# Patient Record
Sex: Male | Born: 1955 | Race: White | Hispanic: No | Marital: Single | Smoking: Former smoker
Health system: Southern US, Community
[De-identification: ages and names within clinical notes are randomized; demographics above are authoritative.]

## PROBLEM LIST (undated history)

## (undated) DIAGNOSIS — F200 Paranoid schizophrenia: Secondary | ICD-10-CM

## (undated) DIAGNOSIS — E291 Testicular hypofunction: Secondary | ICD-10-CM

## (undated) DIAGNOSIS — J189 Pneumonia, unspecified organism: Secondary | ICD-10-CM

## (undated) DIAGNOSIS — E871 Hypo-osmolality and hyponatremia: Secondary | ICD-10-CM

## (undated) DIAGNOSIS — M810 Age-related osteoporosis without current pathological fracture: Secondary | ICD-10-CM

## (undated) DIAGNOSIS — E785 Hyperlipidemia, unspecified: Secondary | ICD-10-CM

## (undated) DIAGNOSIS — J9690 Respiratory failure, unspecified, unspecified whether with hypoxia or hypercapnia: Secondary | ICD-10-CM

## (undated) DIAGNOSIS — I10 Essential (primary) hypertension: Secondary | ICD-10-CM

## (undated) DIAGNOSIS — E559 Vitamin D deficiency, unspecified: Secondary | ICD-10-CM

## (undated) DIAGNOSIS — T148XXA Other injury of unspecified body region, initial encounter: Secondary | ICD-10-CM

## (undated) HISTORY — PX: NO PAST SURGERIES: SHX2092

---

## 2005-12-02 ENCOUNTER — Emergency Department (HOSPITAL_COMMUNITY): Admission: EM | Admit: 2005-12-02 | Discharge: 2005-12-02 | Payer: Self-pay | Admitting: Emergency Medicine

## 2014-09-04 ENCOUNTER — Emergency Department (HOSPITAL_COMMUNITY)
Admission: EM | Admit: 2014-09-04 | Discharge: 2014-09-05 | Disposition: A | Discharge status: Home / Self Care | Payer: Medicare Other | Attending: Emergency Medicine | Admitting: Emergency Medicine

## 2014-09-04 ENCOUNTER — Encounter (HOSPITAL_COMMUNITY): Payer: Self-pay | Admitting: Emergency Medicine

## 2014-09-04 DIAGNOSIS — L0231 Cutaneous abscess of buttock: Secondary | ICD-10-CM | POA: Diagnosis present

## 2014-09-04 DIAGNOSIS — Z8781 Personal history of (healed) traumatic fracture: Secondary | ICD-10-CM | POA: Diagnosis not present

## 2014-09-04 DIAGNOSIS — Z8701 Personal history of pneumonia (recurrent): Secondary | ICD-10-CM | POA: Diagnosis not present

## 2014-09-04 DIAGNOSIS — L03317 Cellulitis of buttock: Secondary | ICD-10-CM | POA: Diagnosis not present

## 2014-09-04 DIAGNOSIS — D171 Benign lipomatous neoplasm of skin and subcutaneous tissue of trunk: Secondary | ICD-10-CM

## 2014-09-04 DIAGNOSIS — Z8659 Personal history of other mental and behavioral disorders: Secondary | ICD-10-CM | POA: Diagnosis not present

## 2014-09-04 DIAGNOSIS — D1779 Benign lipomatous neoplasm of other sites: Secondary | ICD-10-CM | POA: Insufficient documentation

## 2014-09-04 DIAGNOSIS — Z8709 Personal history of other diseases of the respiratory system: Secondary | ICD-10-CM | POA: Insufficient documentation

## 2014-09-04 DIAGNOSIS — Z8739 Personal history of other diseases of the musculoskeletal system and connective tissue: Secondary | ICD-10-CM | POA: Diagnosis not present

## 2014-09-04 DIAGNOSIS — Z87891 Personal history of nicotine dependence: Secondary | ICD-10-CM | POA: Diagnosis not present

## 2014-09-04 DIAGNOSIS — I10 Essential (primary) hypertension: Secondary | ICD-10-CM | POA: Diagnosis not present

## 2014-09-04 DIAGNOSIS — Z87448 Personal history of other diseases of urinary system: Secondary | ICD-10-CM | POA: Insufficient documentation

## 2014-09-04 DIAGNOSIS — Z8639 Personal history of other endocrine, nutritional and metabolic disease: Secondary | ICD-10-CM | POA: Diagnosis not present

## 2014-09-04 HISTORY — DX: Pneumonia, unspecified organism: J18.9

## 2014-09-04 HISTORY — DX: Age-related osteoporosis without current pathological fracture: M81.0

## 2014-09-04 HISTORY — DX: Hypo-osmolality and hyponatremia: E87.1

## 2014-09-04 HISTORY — DX: Paranoid schizophrenia: F20.0

## 2014-09-04 HISTORY — DX: Hyperlipidemia, unspecified: E78.5

## 2014-09-04 HISTORY — DX: Testicular hypofunction: E29.1

## 2014-09-04 HISTORY — DX: Other injury of unspecified body region, initial encounter: T14.8XXA

## 2014-09-04 HISTORY — DX: Vitamin D deficiency, unspecified: E55.9

## 2014-09-04 HISTORY — DX: Respiratory failure, unspecified, unspecified whether with hypoxia or hypercapnia: J96.90

## 2014-09-04 HISTORY — DX: Essential (primary) hypertension: I10

## 2014-09-04 MED ORDER — CLINDAMYCIN HCL 150 MG PO CAPS: 150.0000 mg | ORAL_CAPSULE | Freq: Four times a day (QID) | ORAL | Status: DC

## 2014-09-04 MED ORDER — CLINDAMYCIN HCL 300 MG PO CAPS
300.0000 mg | ORAL_CAPSULE | Freq: Once | ORAL | Status: AC
  Administered 2014-09-04: 300 mg via ORAL
  Filled 2014-09-04: qty 1

## 2014-09-04 NOTE — ED Notes (Signed)
patient has a big abcess on lower right hip and upper spine. Patient is in [facility] se[facility] home. Patient is not complaining of pain.

## 2014-09-04 NOTE — ED Notes (Signed)
Bed: GX27 Expected date:  Expected time:  Means of arrival:  Comments: EMS abscess to back - softball size, tachy

## 2014-09-04 NOTE — Discharge Instructions (Signed)
Please read and follow all provided instructions.  Your diagnoses today include:  1. Cellulitis of buttock   2. Lipoma of buttock    Tests performed today include:  Vital signs. See below for your results today.   Medications prescribed:   Clindamycin - antibiotic  You have been prescribed an antibiotic medicine: take the entire course of medicine even if you are feeling better. Stopping early can cause the antibiotic not to work.  Take any prescribed medications only as directed.   Home care instructions:  Follow any educational materials contained in this packet. Keep affected area above the level of your heart when possible. Wash area gently twice a day with warm soapy water. Do not apply alcohol or hydrogen peroxide. Cover the area if it draining or weeping.   Follow-up instructions: See you doctor for a recheck if your symptoms are not significantly improved.   Please follow-up with your primary care provider in the next 1 week for further evaluation of your symptoms.   Return instructions:  Return to the Emergency Department if you have:  Fever  Worsening symptoms  Worsening pain  Worsening swelling  Redness of the skin that moves away from the affected area, especially if it streaks away from the affected area   Any other emergent concerns  Your vital signs today were: BP 136/87 mmHg   Pulse 95   Temp(Src) 99.4 F (37.4 C) (Rectal)   Resp 23   SpO2 93% If your blood pressure (BP) was elevated above 135/85 this visit, please have this repeated by your doctor within one month. --------------

## 2014-09-04 NOTE — ED Provider Notes (Signed)
CSN: [id number]     Arrival date & time 09/04/14  2023 History   First MD Initiated Contact with Patient 09/04/14 2042     Chief Complaint  Patient presents with  . Abscess    patient has a big abcess on lower right hip and upper spine. Patient is in [facility] [place] [facility] home. Patient is not complaining of pain.      (Consider location/radiation/quality/duration/timing/severity/associated sxs/prior Treatment) HPI Comments: Patients with paranoid schizophrenia, nursing home resident, presents by EMS with complaint of abscess. Patient states that he has had a swelling of his right upper buttock for at least 10 years. Per nursing home report, patient had increased redness and swelling overlying this area and he was sent to the emergency department for evaluation. Patient denies fever, nausea, or vomiting. No diarrhea. No drainage from the area. No treatments prior to arrival. Patient states that the area is not painful and denies all other medical complaints.  Patient is a 58 y.o. male presenting with abscess. The history is provided by the patient, the nursing home and medical records.  Abscess Associated symptoms: no fever, no headaches, no nausea and no vomiting     Past Medical History  Diagnosis Date  . Paranoid schizophrenia   . Osteoporosis   . Respiratory failure hypoxic  . Vitamin D deficiency   . Hypertension   . Hyperlipidemia   . Hypogonadism in male   . Pneumonia healthcare-associated  . Hyponatremia   . Fracture spine   History reviewed. No pertinent past surgical history. History reviewed. No pertinent family history. History  Substance Use Topics  . Smoking status: Former Research scientist (life sciences)  . Smokeless tobacco: Never Used  . Alcohol Use: No    Review of Systems  Constitutional: Negative for fever.  HENT: Negative for rhinorrhea and sore throat.   Eyes: Negative for redness.  Respiratory: Negative for cough.   Cardiovascular: Negative for chest pain.   Gastrointestinal: Negative for nausea, vomiting, abdominal pain and diarrhea.  Genitourinary: Negative for dysuria.  Musculoskeletal: Negative for myalgias and back pain.  Skin: Positive for color change. Negative for rash.  Neurological: Negative for headaches.  Hematological: Negative for adenopathy.      Allergies  Review of patient's allergies indicates no known allergies.  Home Medications   Prior to Admission medications   Medication Sig Start Date End Date Taking? Authorizing Provider  clindamycin (CLEOCIN) 150 MG capsule Take 1 capsule (150 mg total) by mouth every 6 (six) hours. 09/04/14   Carlisle Cater, PA-C   BP 120/102 mmHg  Pulse 107  Temp(Src) 99.1 F (37.3 C) (Oral)  Resp 19  SpO2 96% Physical Exam  Constitutional: He appears well-developed and well-nourished.  HENT:  Head: Normocephalic and atraumatic.  Eyes: Conjunctivae are normal.  Neck: Normal range of motion. Neck supple.  Pulmonary/Chest: No respiratory distress.  Neurological: He is alert.  Skin: Skin is warm and dry.  Patient with a large 6-7 cm diameter area of soft tissue swelling on the right upper buttocks consistent with lipoma. Overlying the center is erythema with a central ulceration measuring approximately 8 mm. There is a minimal amount of purulent drainage. There is no induration or fluid collection palpated and no signs of abscess. Area is nontender.  Psychiatric: He has a normal mood and affect.  Nursing note and vitals reviewed.   ED Course  Procedures (including critical care time) Labs Review Labs Reviewed - No data to display  Imaging Review No results found.  EKG Interpretation None       Patient seen and examined. Medications ordered. He appears well, non-toxic.   Vital signs reviewed and are as follows: BP 120/102 mmHg  Pulse 107  Temp(Src) 99.1 F (37.3 C) (Oral)  Resp 19  SpO2 96%  Will treat with clindamycin. Encouraged PCP follow-up in 2-3 days for  recheck. Vital signs are stable. He does not look toxic. Feel that he is safe for discharge to home.  MDM   Final diagnoses:  Cellulitis of buttock  Lipoma of buttock   Patient sent to the emergency department for evaluation of "abscess". Patient has a long-standing lipoma with superficial cellulitic infection overlying at this point. Lipoma does not seem to be new per patient. The redness and swelling appears to be new. Will treat and have patient follow-up with PCP.   Carlisle Cater, PA-C 09/04/14 [place], MD 09/04/14 [phone]

## 2014-10-20 ENCOUNTER — Encounter (HOSPITAL_COMMUNITY): Payer: Self-pay

## 2014-10-20 ENCOUNTER — Inpatient Hospital Stay (HOSPITAL_COMMUNITY)
Admission: EM | Admit: 2014-10-20 | Discharge: 2014-10-27 | DRG: 643 | Disposition: A | Discharge status: Skilled Nursing Facility | Payer: Medicare Other | Attending: Internal Medicine | Admitting: Internal Medicine

## 2014-10-20 ENCOUNTER — Emergency Department (HOSPITAL_COMMUNITY): Payer: Medicare Other

## 2014-10-20 DIAGNOSIS — Z87891 Personal history of nicotine dependence: Secondary | ICD-10-CM | POA: Diagnosis not present

## 2014-10-20 DIAGNOSIS — E291 Testicular hypofunction: Secondary | ICD-10-CM | POA: Diagnosis present

## 2014-10-20 DIAGNOSIS — J439 Emphysema, unspecified: Secondary | ICD-10-CM | POA: Diagnosis present

## 2014-10-20 DIAGNOSIS — I10 Essential (primary) hypertension: Secondary | ICD-10-CM | POA: Diagnosis present

## 2014-10-20 DIAGNOSIS — E785 Hyperlipidemia, unspecified: Secondary | ICD-10-CM | POA: Diagnosis present

## 2014-10-20 DIAGNOSIS — F200 Paranoid schizophrenia: Secondary | ICD-10-CM | POA: Diagnosis present

## 2014-10-20 DIAGNOSIS — R748 Abnormal levels of other serum enzymes: Secondary | ICD-10-CM | POA: Diagnosis present

## 2014-10-20 DIAGNOSIS — Z79899 Other long term (current) drug therapy: Secondary | ICD-10-CM | POA: Diagnosis not present

## 2014-10-20 DIAGNOSIS — D72829 Elevated white blood cell count, unspecified: Secondary | ICD-10-CM | POA: Diagnosis present

## 2014-10-20 DIAGNOSIS — E559 Vitamin D deficiency, unspecified: Secondary | ICD-10-CM | POA: Diagnosis present

## 2014-10-20 DIAGNOSIS — R000 Tachycardia, unspecified: Secondary | ICD-10-CM | POA: Diagnosis present

## 2014-10-20 DIAGNOSIS — M810 Age-related osteoporosis without current pathological fracture: Secondary | ICD-10-CM | POA: Diagnosis present

## 2014-10-20 DIAGNOSIS — E222 Syndrome of inappropriate secretion of antidiuretic hormone: Secondary | ICD-10-CM | POA: Diagnosis not present

## 2014-10-20 DIAGNOSIS — W19XXXA Unspecified fall, initial encounter: Secondary | ICD-10-CM | POA: Diagnosis present

## 2014-10-20 DIAGNOSIS — R451 Restlessness and agitation: Secondary | ICD-10-CM | POA: Diagnosis present

## 2014-10-20 DIAGNOSIS — E871 Hypo-osmolality and hyponatremia: Secondary | ICD-10-CM | POA: Diagnosis present

## 2014-10-20 DIAGNOSIS — Z993 Dependence on wheelchair: Secondary | ICD-10-CM | POA: Diagnosis not present

## 2014-10-20 DIAGNOSIS — G9341 Metabolic encephalopathy: Secondary | ICD-10-CM | POA: Diagnosis present

## 2014-10-20 LAB — URINALYSIS, ROUTINE W REFLEX MICROSCOPIC
BILIRUBIN URINE: NEGATIVE
Glucose, UA: NEGATIVE mg/dL
HGB URINE DIPSTICK: NEGATIVE
KETONES UR: 15 mg/dL — AB
Leukocytes, UA: NEGATIVE
Nitrite: NEGATIVE
Protein, ur: 30 mg/dL — AB
Specific Gravity, Urine: 1.015 (ref 1.005–1.030)
UROBILINOGEN UA: 0.2 mg/dL (ref 0.0–1.0)
pH: 7.5 (ref 5.0–8.0)

## 2014-10-20 LAB — CBC WITH DIFFERENTIAL/PLATELET
Basophils Absolute: 0 10*3/uL (ref 0.0–0.1)
Basophils Relative: 0 % (ref 0–1)
Eosinophils Absolute: 0 10*3/uL (ref 0.0–0.7)
Eosinophils Relative: 0 % (ref 0–5)
HEMATOCRIT: 35.5 % — AB (ref 39.0–52.0)
HEMOGLOBIN: 13.1 g/dL (ref 13.0–17.0)
Lymphocytes Relative: 9 % — ABNORMAL LOW (ref 12–46)
Lymphs Abs: 1.5 10*3/uL (ref 0.7–4.0)
MCH: 31 pg (ref 26.0–34.0)
MCHC: 36.9 g/dL — ABNORMAL HIGH (ref 30.0–36.0)
MCV: 84.1 fL (ref 78.0–100.0)
Monocytes Absolute: 1.5 10*3/uL — ABNORMAL HIGH (ref 0.1–1.0)
Monocytes Relative: 9 % (ref 3–12)
NEUTROS ABS: 13.2 10*3/uL — AB (ref 1.7–7.7)
NEUTROS PCT: 82 % — AB (ref 43–77)
Platelets: 368 10*3/uL (ref 150–400)
RBC: 4.22 MIL/uL (ref 4.22–5.81)
RDW: 12.8 % (ref 11.5–15.5)
WBC: 16.2 10*3/uL — AB (ref 4.0–10.5)

## 2014-10-20 LAB — COMPREHENSIVE METABOLIC PANEL
ALBUMIN: 3.5 g/dL (ref 3.5–5.2)
ALK PHOS: 85 U/L (ref 39–117)
ALT: 20 U/L (ref 0–53)
AST: 42 U/L — ABNORMAL HIGH (ref 0–37)
Anion gap: 19 — ABNORMAL HIGH (ref 5–15)
BILIRUBIN TOTAL: 0.8 mg/dL (ref 0.3–1.2)
BUN: 6 mg/dL (ref 6–23)
CO2: 23 mmol/L (ref 19–32)
CREATININE: 0.52 mg/dL (ref 0.50–1.35)
Calcium: 9.3 mg/dL (ref 8.4–10.5)
Chloride: 72 mEq/L — ABNORMAL LOW (ref 96–112)
GFR calc non Af Amer: >90 mL/min (ref >90)
GLUCOSE: 134 mg/dL — AB (ref 70–99)
POTASSIUM: 3.8 mmol/L (ref 3.5–5.1)
Sodium: 114 mmol/L — CL (ref 135–145)
TOTAL PROTEIN: 6.5 g/dL (ref 6.0–8.3)

## 2014-10-20 LAB — URINE MICROSCOPIC-ADD ON

## 2014-10-20 LAB — BASIC METABOLIC PANEL
Anion gap: 9 (ref 5–15)
BUN: 8 mg/dL (ref 6–23)
CALCIUM: 8.4 mg/dL (ref 8.4–10.5)
CO2: 26 mmol/L (ref 19–32)
Chloride: 83 mEq/L — ABNORMAL LOW (ref 96–112)
Creatinine, Ser: 0.48 mg/dL — ABNORMAL LOW (ref 0.50–1.35)
GLUCOSE: 117 mg/dL — AB (ref 70–99)
POTASSIUM: 3.5 mmol/L (ref 3.5–5.1)
SODIUM: 118 mmol/L — AB (ref 135–145)

## 2014-10-20 LAB — SODIUM, URINE, RANDOM: Sodium, Ur: 22 mmol/L

## 2014-10-20 LAB — OSMOLALITY: Osmolality: 238 mOsm/kg — ABNORMAL LOW (ref 275–300)

## 2014-10-20 LAB — TSH: TSH: 0.919 u[IU]/mL (ref 0.350–4.500)

## 2014-10-20 MED ORDER — SODIUM CHLORIDE 0.9 % IV BOLUS (SEPSIS)
500.0000 mL | Freq: Once | INTRAVENOUS | Status: AC
Start: 2014-10-20 — End: 2014-10-20
  Administered 2014-10-20: 500 mL via INTRAVENOUS

## 2014-10-20 MED ORDER — LORAZEPAM 2 MG/ML IJ SOLN
0.5000 mg | Freq: Once | INTRAMUSCULAR | Status: AC
  Administered 2014-10-20: 0.5 mg via INTRAVENOUS
  Filled 2014-10-20: qty 1

## 2014-10-20 MED ORDER — LORAZEPAM 2 MG/ML IJ SOLN
1.0000 mg | Freq: Once | INTRAMUSCULAR | Status: AC
  Administered 2014-10-20: 1 mg via INTRAVENOUS
  Filled 2014-10-20: qty 1

## 2014-10-20 MED ORDER — DEXTROSE 5 % IV SOLN
1.0000 g | Freq: Once | INTRAVENOUS | Status: AC
  Administered 2014-10-20: 1 g via INTRAVENOUS
  Filled 2014-10-20: qty 10

## 2014-10-20 MED ORDER — SODIUM CHLORIDE 0.9 % IV BOLUS (SEPSIS)
1000.0000 mL | Freq: Once | INTRAVENOUS | Status: AC
  Administered 2014-10-20: 1000 mL via INTRAVENOUS

## 2014-10-20 NOTE — ED Provider Notes (Signed)
CSN: [id number]     Arrival date & time 10/20/14  59 History   First MD Initiated Contact with Patient 10/20/14 1643     Chief Complaint  Patient presents with  . Hypertension  . Agitation    Level V caveat secondary to illness and schizophrenia (Consider location/radiation/quality/duration/timing/severity/associated sxs/prior Treatment) HPI   59 year old male with a history of schizophrenia sent here from nursing home with reports that he has been more agitated than usual. He normally gets multiple behavior health medications including Risperdal and Ativan on a regular basis. Patient only answers when questioned her my entire interview with the single word. This has been consistent throughout nursing and, physician, and EMS interactions. Attempted to contact nursing home have not been able to call them. We have also attempted to call the daughter had not been able to get through her.  Past Medical History  Diagnosis Date  . Paranoid schizophrenia   . Osteoporosis   . Respiratory failure hypoxic  . Vitamin D deficiency   . Hypertension   . Hyperlipidemia   . Hypogonadism in male   . Pneumonia healthcare-associated  . Hyponatremia   . Fracture spine   History reviewed. No pertinent past surgical history. No family history on file. History  Substance Use Topics  . Smoking status: Former Research scientist (life sciences)  . Smokeless tobacco: Never Used  . Alcohol Use: No    Review of Systems  Unable to perform ROS     Allergies  Review of patient's allergies indicates no known allergies.  Home Medications   Prior to Admission medications   Medication Sig Start Date End Date Taking? Authorizing Provider  benztropine (COGENTIN) 1 MG tablet Take 1 mg by mouth 2 (two) times daily.   Yes Historical Provider, MD  calcitonin, salmon, (MIACALCIN/FORTICAL) 200 UNIT/ACT nasal spray Place 1 spray into alternate nostrils daily.   Yes Historical Provider, MD  escitalopram (LEXAPRO) 20 MG tablet Take 20  mg by mouth daily.   Yes Historical Provider, MD  LORazepam (ATIVAN) 1 MG tablet Take 1 mg by mouth every 8 (eight) hours.   Yes Historical Provider, MD  metoprolol tartrate (LOPRESSOR) 25 MG tablet Take 25 mg by mouth 2 (two) times daily.   Yes Historical Provider, MD  risperiDONE (RISPERDAL) 2 MG tablet Take 2 mg by mouth 2 (two) times daily.   Yes Historical Provider, MD  risperiDONE (RISPERDAL) 3 MG tablet Take 3 mg by mouth at bedtime.   Yes Historical Provider, MD  simvastatin (ZOCOR) 20 MG tablet Take 20 mg by mouth at bedtime.   Yes Historical Provider, MD  clindamycin (CLEOCIN) 150 MG capsule Take 1 capsule (150 mg total) by mouth every 6 (six) hours. 09/04/14   Carlisle Cater, PA-C   BP 149/89 mmHg  Pulse 114  Temp(Src) 97.7 F (36.5 C) (Axillary)  Resp 21  SpO2 94% Physical Exam  Constitutional: He appears well-developed and well-nourished.  HENT:  Head: Normocephalic and atraumatic.  Right Ear: External ear normal.  Left Ear: External ear normal.  Lips appear dry  Eyes: Conjunctivae are normal. Pupils are equal, round, and reactive to light.  Neck: Normal range of motion.  Posterior neck with lipoma appears to be nontender well delineated edges  Cardiovascular: Tachycardia present.   Pulmonary/Chest: Effort normal and breath sounds normal.  Abdominal: Soft. Bowel sounds are normal.  Musculoskeletal: Normal range of motion. He exhibits no edema or tenderness.  Neurological: He is alert.  I am unable to assess whether or not the patient  is oriented as he does not answer questions he does obey commands and cooperative with exam and moves all extremities equally. Deep tendon reflexes are equal.  Skin: Skin is warm and dry.  Assessed skin see no signs of cellulitis  Nursing note and vitals reviewed.   ED Course  Procedures (including critical care time) Labs Review Labs Reviewed  CBC WITH DIFFERENTIAL - Abnormal; Notable for the following:    WBC 16.2 (*)    HCT 35.5  (*)    MCHC 36.9 (*)    Neutrophils Relative % 82 (*)    Lymphocytes Relative 9 (*)    Neutro Abs 13.2 (*)    Monocytes Absolute 1.5 (*)    All other components within normal limits  COMPREHENSIVE METABOLIC PANEL - Abnormal; Notable for the following:    Sodium 114 (*)    Chloride 72 (*)    Glucose, Bld 134 (*)    AST 42 (*)    Anion gap 19 (*)    All other components within normal limits  URINALYSIS, ROUTINE W REFLEX MICROSCOPIC - Abnormal; Notable for the following:    APPearance HAZY (*)    Ketones, ur 15 (*)    Protein, ur 30 (*)    All other components within normal limits  URINE MICROSCOPIC-ADD ON  OSMOLALITY    Imaging Review Dg Chest Port 1 View  10/20/2014   CLINICAL DATA:  Altered mental status for 1 day. History of hypertension and smoking. Initial encounter.  EXAM: PORTABLE CHEST - 1 VIEW  COMPARISON:  08/21/2014 and 08/19/2014 radiographs; CT 08/03/2014.  FINDINGS: 1827 hr. The heart size and mediastinal contours appear stable allowing for some patient rotation to the right. This is likely responsible for mild tracheal deviation to the right. The pulmonary edema noted previously has resolved. There is evidence of underlying mild emphysema. No superimposed airspace disease or pleural effusion identified. Advanced glenohumeral degenerative changes are present on the left. The patient is status post right total shoulder arthroplasty.  IMPRESSION: No acute cardiopulmonary process.  Mild emphysema.   Electronically Signed   By: Camie Patience M.D.   On: 10/20/2014 18:52     EKG Interpretation   Date/Time:  Wednesday October 20 2014 18:07:51 EST Ventricular Rate:  115 PR Interval:  160 QRS Duration: 87 QT Interval:  349 QTC Calculation: 483 R Axis:   -4 Text Interpretation:  Sinus tachycardia Borderline prolonged QT interval  Confirmed by Tilda Samudio MD, Kennadie Brenner ([id number]) on 10/20/2014 8:35:07 PM     Filed Vitals:   10/20/14 1731 10/20/14 1815 10/20/14 1845 10/20/14 1915  BP:  164/93 150/103 139/89 149/89  Pulse: 117 114 112 114  Temp:      TempSrc:      Resp: 18 21    SpO2: 99% 98% 95% 94%    MDM   Final diagnoses:  Agitated  Hyponatremia  Paranoid schizophrenia   59 year old male with increased agitation with history of schizophrenia. Since workup here is significant for sodium of 114. He has had no evidence of seizure . It is unclear the chronicity of the hyponatremia. His symptoms appear to be mild to moderate without any evidence of seizure, obtundation, coma, respiratory distress. He does appear agitated from baseline which could be due to his hyponatremia. Patient is receiving IV normal saline. A serum osmolality is pending. Plan admission.  Discussed with Dr. Hal Hope who asks that critical care consult be obtained.  Critical care paged.   Shaune Pollack, MD 10/21/14  0011 

## 2014-10-20 NOTE — ED Notes (Signed)
Monia Pouch  [phone]  Sister Lives in [place] and is three hours behind. States she turns off her phone at night and will answer to a voicemail.

## 2014-10-20 NOTE — ED Notes (Addendum)
RN Lennette Bihari reports witnessed fall. Patient slid to the end of the bed and stood up on his own. Patient wobbled before RN could get to patient and then RN reported to this RN that patient fell to the ground on the left side. Report patient did not hit his head. Patient fell on his left side and hit left thigh and left arm. Patient was able to get back in bed on his own. Patient denies any pain and does not have any noted injuries from the fall. MD Ray made aware. Patient is in bed and hooked up to the monitor when arrived to the room. Not agitated. No injuries noted. MD assessed patient for injury.

## 2014-10-20 NOTE — ED Notes (Signed)
Attempted to call facility and daughter multiple times to receive information about patient's baseline. Unable to reach anyone from multiple numbers.

## 2014-10-20 NOTE — ED Notes (Signed)
Admitting MD at the bedside.  

## 2014-10-20 NOTE — Consult Note (Signed)
Name: Jeremiah [name] MRN: [id number] DOB: 1956-03-18    ADMISSION DATE:  10/20/2014 CONSULTATION DATE:  10/20/14  REFERRING MD :  Pattricia Boss  CHIEF COMPLAINT:  Hyponatremia  BRIEF PATIENT DESCRIPTION: Jeremiah [name] is a 59yo man w/ PMHx of paranoid schizophrenia and HTN who presented to the ED from [facility] after he was found to have a change in mental status. Found to be hyponatremic with Na 114. No seizure activity noted.   SIGNIFICANT EVENTS  1/13>> Change in mental status per nursing home, Na 114  STUDIES:  CXR 1/13>> mild emphysema, no acute process   HISTORY OF PRESENT ILLNESS:  Jeremiah [name] is a 59yo man w/ PMHx of paranoid schizophrenia and HTN who presented to the ED from [facility] after he was found to have a change in mental status. Report from nursing home states he was more agitated than usual, diaphoretic, and was pacing in his wheelchair. In the ED, he was found to be hyponatremic with Na of 114. PCCM was consulted. No reported seizures at nursing home or in the ED. He responds minimally with yes or no to questions. Does not appear to be in any acute distress. Last Na on record was 136 in November 2015.   PAST MEDICAL HISTORY :   has a past medical history of Paranoid schizophrenia; Osteoporosis; Respiratory failure (hypoxic); Vitamin D deficiency; Hypertension; Hyperlipidemia; Hypogonadism in male; Pneumonia (healthcare-associated); Hyponatremia; and Fracture (spine).  has no past surgical history on file. Prior to Admission medications   Medication Sig Start Date End Date Taking? Authorizing Provider  benztropine (COGENTIN) 1 MG tablet Take 1 mg by mouth 2 (two) times daily.   Yes Historical Provider, MD  calcitonin, salmon, (MIACALCIN/FORTICAL) 200 UNIT/ACT nasal spray Place 1 spray into alternate nostrils daily.   Yes Historical Provider, MD  escitalopram (LEXAPRO) 20 MG tablet Take 20 mg by mouth daily.   Yes Historical Provider, MD  LORazepam  (ATIVAN) 1 MG tablet Take 1 mg by mouth every 8 (eight) hours.   Yes Historical Provider, MD  metoprolol tartrate (LOPRESSOR) 25 MG tablet Take 25 mg by mouth 2 (two) times daily.   Yes Historical Provider, MD  risperiDONE (RISPERDAL) 2 MG tablet Take 2 mg by mouth 2 (two) times daily.   Yes Historical Provider, MD  risperiDONE (RISPERDAL) 3 MG tablet Take 3 mg by mouth at bedtime.   Yes Historical Provider, MD  simvastatin (ZOCOR) 20 MG tablet Take 20 mg by mouth at bedtime.   Yes Historical Provider, MD  clindamycin (CLEOCIN) 150 MG capsule Take 1 capsule (150 mg total) by mouth every 6 (six) hours. 09/04/14   Carlisle Cater, PA-C   No Known Allergies  FAMILY HISTORY:  family history is not on file. SOCIAL HISTORY:  reports that he has quit smoking. He has never used smokeless tobacco. He reports that he does not drink alcohol or use illicit drugs.  REVIEW OF SYSTEMS:   Not able to complete due to patient's mental status  SUBJECTIVE: Responds yes or no to questions occasionally.   VITAL SIGNS: Temp:  [97.7 F (36.5 C)] 97.7 F (36.5 C) (01/13 1557) Pulse Rate:  [112-126] 126 (01/13 2100) Resp:  [18-21] 21 (01/13 2100) BP: (139-187)/(89-110) 187/110 mmHg (01/13 2100) SpO2:  [94 %-99 %] 95 % (01/13 2100)  PHYSICAL EXAMINATION: General:  Awake, alert, laying in bed Neuro: alert, only answers some questions with yes or no. Follows commands. EOMI, PERRL. Strength intact. Mild fasciculations noted in right  calf.  HEENT:  [place]/AT, EOMI, PERRL, mucus membranes dry Cardiovascular: tachycardic in 100s, normal S1/S2, no m/g/r Lungs: CTA bilaterally, breaths non-labored Abdomen:  BS+, soft, non-tender Musculoskeletal: warm, no edema, moves all Skin:  Warm and well perfused   Recent Labs Lab 10/20/14 1759  NA 114*  K 3.8  CL 72*  CO2 23  BUN 6  CREATININE 0.52  GLUCOSE 134*    Recent Labs Lab 10/20/14 1759  HGB 13.1  HCT 35.5*  WBC 16.2*  PLT 368   Dg Chest Port 1  View  10/20/2014   CLINICAL DATA:  Altered mental status for 1 day. History of hypertension and smoking. Initial encounter.  EXAM: PORTABLE CHEST - 1 VIEW  COMPARISON:  08/21/2014 and 08/19/2014 radiographs; CT 08/03/2014.  FINDINGS: 1827 hr. The heart size and mediastinal contours appear stable allowing for some patient rotation to the right. This is likely responsible for mild tracheal deviation to the right. The pulmonary edema noted previously has resolved. There is evidence of underlying mild emphysema. No superimposed airspace disease or pleural effusion identified. Advanced glenohumeral degenerative changes are present on the left. The patient is status post right total shoulder arthroplasty.  IMPRESSION: No acute cardiopulmonary process.  Mild emphysema.   Electronically Signed   By: Camie Patience M.D.   On: 10/20/2014 18:52    ASSESSMENT / PLAN:  Hyponatremia A: Na 114 on admission. Likely medication induced. Patient is on SSRI (Lexapro) and Risperdal for his schizophrenia which are known to cause hyponatremia. He does not have chronic liver disease or heart failure to suggest volume depletion as the cause. He is also not on any diuretics.   P: Recommend to work up hyponatremia with urine sodium, urine osm, serum osm.  Check TSH Check bmets Q4H 600 cc water restriction Admit to step down PCCM will continue to follow, [facility] to admit  Albin Felling, MD Internal Medicine Resident, PGY-1  Pulmonary and [place] Pager: [phone]  10/20/2014, 9:54 PM

## 2014-10-20 NOTE — ED Notes (Signed)
Per EMS, Patient is coming from [facility] and they report increase agitation since 1000. Patient given Ativan per MD order. Patient had increase BP and HR this morning. Vitals per EMS: 151/100, 104 HR, 20 RR, 97% on RA, 108 CBG.

## 2014-10-20 NOTE — ED Notes (Signed)
Called Staffing for Safety sitter and made Charge aware of the call. Patient currently sleeping.

## 2014-10-20 NOTE — ED Notes (Signed)
Nevin Bloodgood, NT sitting at patient bedside.

## 2014-10-20 NOTE — ED Notes (Signed)
Walking by pt's room and witnessed pt stand up out of bed and fall to the floor onto his left side. Assisted pt back up to bed with NT. Pt not in any acute distress. Pt able to stand with assistance. No pain or injury noted.

## 2014-10-21 ENCOUNTER — Inpatient Hospital Stay (HOSPITAL_COMMUNITY): Payer: Medicare Other

## 2014-10-21 ENCOUNTER — Encounter (HOSPITAL_COMMUNITY): Payer: Self-pay | Admitting: Internal Medicine

## 2014-10-21 DIAGNOSIS — F200 Paranoid schizophrenia: Secondary | ICD-10-CM

## 2014-10-21 DIAGNOSIS — G9341 Metabolic encephalopathy: Secondary | ICD-10-CM | POA: Diagnosis present

## 2014-10-21 DIAGNOSIS — E785 Hyperlipidemia, unspecified: Secondary | ICD-10-CM | POA: Diagnosis present

## 2014-10-21 DIAGNOSIS — F259 Schizoaffective disorder, unspecified: Secondary | ICD-10-CM

## 2014-10-21 DIAGNOSIS — R451 Restlessness and agitation: Secondary | ICD-10-CM

## 2014-10-21 DIAGNOSIS — R748 Abnormal levels of other serum enzymes: Secondary | ICD-10-CM | POA: Diagnosis present

## 2014-10-21 DIAGNOSIS — E871 Hypo-osmolality and hyponatremia: Secondary | ICD-10-CM

## 2014-10-21 DIAGNOSIS — I10 Essential (primary) hypertension: Secondary | ICD-10-CM | POA: Diagnosis present

## 2014-10-21 DIAGNOSIS — E222 Syndrome of inappropriate secretion of antidiuretic hormone: Secondary | ICD-10-CM | POA: Diagnosis present

## 2014-10-21 LAB — CBC WITH DIFFERENTIAL/PLATELET
Basophils Absolute: 0 10*3/uL (ref 0.0–0.1)
Basophils Relative: 0 % (ref 0–1)
EOS ABS: 0 10*3/uL (ref 0.0–0.7)
Eosinophils Relative: 0 % (ref 0–5)
HEMATOCRIT: 34.8 % — AB (ref 39.0–52.0)
HEMOGLOBIN: 12.8 g/dL — AB (ref 13.0–17.0)
LYMPHS PCT: 7 % — AB (ref 12–46)
Lymphs Abs: 0.8 10*3/uL (ref 0.7–4.0)
MCH: 31.8 pg (ref 26.0–34.0)
MCHC: 36.8 g/dL — ABNORMAL HIGH (ref 30.0–36.0)
MCV: 86.6 fL (ref 78.0–100.0)
MONO ABS: 1.7 10*3/uL — AB (ref 0.1–1.0)
Monocytes Relative: 15 % — ABNORMAL HIGH (ref 3–12)
NEUTROS PCT: 78 % — AB (ref 43–77)
Neutro Abs: 9.1 10*3/uL — ABNORMAL HIGH (ref 1.7–7.7)
Platelets: 325 10*3/uL (ref 150–400)
RBC: 4.02 MIL/uL — ABNORMAL LOW (ref 4.22–5.81)
RDW: 13 % (ref 11.5–15.5)
WBC: 11.2 10*3/uL — ABNORMAL HIGH (ref 4.0–10.5)

## 2014-10-21 LAB — AMMONIA: Ammonia: 31 umol/L (ref 11–32)

## 2014-10-21 LAB — BASIC METABOLIC PANEL
ANION GAP: 8 (ref 5–15)
Anion gap: 7 (ref 5–15)
BUN: 6 mg/dL (ref 6–23)
BUN: 7 mg/dL (ref 6–23)
CHLORIDE: 87 meq/L — AB (ref 96–112)
CHLORIDE: 88 meq/L — AB (ref 96–112)
CO2: 28 mmol/L (ref 19–32)
CO2: 28 mmol/L (ref 19–32)
CREATININE: 0.54 mg/dL (ref 0.50–1.35)
Calcium: 8.7 mg/dL (ref 8.4–10.5)
Calcium: 8.3 mg/dL — ABNORMAL LOW (ref 8.4–10.5)
Creatinine, Ser: 0.47 mg/dL — ABNORMAL LOW (ref 0.50–1.35)
GFR calc Af Amer: >90 mL/min (ref >90)
Glucose, Bld: 114 mg/dL — ABNORMAL HIGH (ref 70–99)
Glucose, Bld: 121 mg/dL — ABNORMAL HIGH (ref 70–99)
Potassium: 3.5 mmol/L (ref 3.5–5.1)
Potassium: 3.6 mmol/L (ref 3.5–5.1)
Sodium: 123 mmol/L — ABNORMAL LOW (ref 135–145)
Sodium: 123 mmol/L — ABNORMAL LOW (ref 135–145)

## 2014-10-21 LAB — HEPATIC FUNCTION PANEL
ALK PHOS: 79 U/L (ref 39–117)
ALT: 19 U/L (ref 0–53)
AST: 44 U/L — AB (ref 0–37)
Albumin: 3.1 g/dL — ABNORMAL LOW (ref 3.5–5.2)
BILIRUBIN DIRECT: 0.1 mg/dL (ref 0.0–0.3)
BILIRUBIN TOTAL: 0.7 mg/dL (ref 0.3–1.2)
Indirect Bilirubin: 0.6 mg/dL (ref 0.3–0.9)
TOTAL PROTEIN: 5.9 g/dL — AB (ref 6.0–8.3)

## 2014-10-21 LAB — OSMOLALITY, URINE: Osmolality, Ur: 342 mOsm/kg — ABNORMAL LOW (ref 390–1090)

## 2014-10-21 LAB — LACTIC ACID, PLASMA: LACTIC ACID, VENOUS: 0.9 mmol/L (ref 0.5–2.2)

## 2014-10-21 LAB — CK: CK TOTAL: 768 U/L — AB (ref 7–232)

## 2014-10-21 LAB — URIC ACID: URIC ACID, SERUM: 3 mg/dL — AB (ref 4.0–7.8)

## 2014-10-21 LAB — MRSA PCR SCREENING: MRSA by PCR: NEGATIVE

## 2014-10-21 MED ORDER — METOPROLOL TARTRATE 1 MG/ML IV SOLN
5.0000 mg | Freq: Three times a day (TID) | INTRAVENOUS | Status: DC
  Administered 2014-10-21 – 2014-10-22 (×3): 5 mg via INTRAVENOUS
  Filled 2014-10-21 (×6): qty 5

## 2014-10-21 MED ORDER — ACETAMINOPHEN 650 MG RE SUPP
650.0000 mg | Freq: Four times a day (QID) | RECTAL | Status: DC | PRN
Start: 2014-10-21 — End: 2014-10-27

## 2014-10-21 MED ORDER — BENZTROPINE MESYLATE 0.5 MG PO TABS
0.5000 mg | ORAL_TABLET | Freq: Two times a day (BID) | ORAL | Status: DC | PRN
  Filled 2014-10-21: qty 1

## 2014-10-21 MED ORDER — CALCITONIN (SALMON) 200 UNIT/ACT NA SOLN
1.0000 | Freq: Every day | NASAL | Status: DC
  Administered 2014-10-21 – 2014-10-27 (×7): 1 via NASAL
  Filled 2014-10-21: qty 3.7

## 2014-10-21 MED ORDER — ONDANSETRON HCL 4 MG PO TABS: 4.0000 mg | ORAL_TABLET | Freq: Four times a day (QID) | ORAL | Status: DC | PRN

## 2014-10-21 MED ORDER — LORAZEPAM 1 MG PO TABS
1.0000 mg | ORAL_TABLET | Freq: Three times a day (TID) | ORAL | Status: DC
  Administered 2014-10-21 – 2014-10-23 (×7): 1 mg via ORAL
  Filled 2014-10-21 (×7): qty 1

## 2014-10-21 MED ORDER — SIMVASTATIN 20 MG PO TABS
20.0000 mg | ORAL_TABLET | Freq: Every day | ORAL | Status: DC
  Administered 2014-10-21: 20 mg via ORAL
  Filled 2014-10-21 (×2): qty 1

## 2014-10-21 MED ORDER — POTASSIUM CHLORIDE CRYS ER 20 MEQ PO TBCR
20.0000 meq | EXTENDED_RELEASE_TABLET | ORAL | Status: AC
  Administered 2014-10-21 (×2): 20 meq via ORAL
  Filled 2014-10-21 (×2): qty 1

## 2014-10-21 MED ORDER — LORAZEPAM 2 MG/ML IJ SOLN
0.5000 mg | Freq: Once | INTRAMUSCULAR | Status: AC
  Administered 2014-10-21: 0.5 mg via INTRAVENOUS
  Filled 2014-10-21: qty 1

## 2014-10-21 MED ORDER — ENOXAPARIN SODIUM 40 MG/0.4ML ~~LOC~~ SOLN
40.0000 mg | Freq: Every day | SUBCUTANEOUS | Status: DC
  Administered 2014-10-21 – 2014-10-27 (×6): 40 mg via SUBCUTANEOUS
  Filled 2014-10-21 (×9): qty 0.4

## 2014-10-21 MED ORDER — LORAZEPAM 2 MG/ML IJ SOLN: 1.0000 mg | INTRAMUSCULAR | Status: DC | PRN

## 2014-10-21 MED ORDER — ACETAMINOPHEN 325 MG PO TABS: 650.0000 mg | ORAL_TABLET | Freq: Four times a day (QID) | ORAL | Status: DC | PRN

## 2014-10-21 MED ORDER — METOPROLOL TARTRATE 25 MG PO TABS
25.0000 mg | ORAL_TABLET | Freq: Two times a day (BID) | ORAL | Status: DC
  Administered 2014-10-21 (×2): 25 mg via ORAL
  Filled 2014-10-21 (×3): qty 1

## 2014-10-21 MED ORDER — SODIUM CHLORIDE 0.9 % IJ SOLN
3.0000 mL | Freq: Two times a day (BID) | INTRAMUSCULAR | Status: DC
  Administered 2014-10-21 – 2014-10-27 (×9): 3 mL via INTRAVENOUS

## 2014-10-21 MED ORDER — ONDANSETRON HCL 4 MG/2ML IJ SOLN: 4.0000 mg | Freq: Four times a day (QID) | INTRAMUSCULAR | Status: DC | PRN

## 2014-10-21 MED ORDER — RISPERIDONE 1 MG PO TABS
1.0000 mg | ORAL_TABLET | Freq: Two times a day (BID) | ORAL | Status: DC
Start: 2014-10-21 — End: 2014-10-27
  Administered 2014-10-21 – 2014-10-27 (×13): 1 mg via ORAL
  Filled 2014-10-21 (×15): qty 1

## 2014-10-21 NOTE — Consult Note (Signed)
[facility] Consult   Reason for Consult:  Medication management and hyponatremia Referring Physician:  Thyra Breed, NP  Jeremiah [name] is an 59 y.o. male. Total Time spent with patient: 45 minutes  Assessment: AXIS I:  Schizoaffective Disorder AXIS II:  Deferred AXIS III:   Past Medical History  Diagnosis Date  . Paranoid schizophrenia   . Osteoporosis   . Respiratory failure hypoxic  . Vitamin D deficiency   . Hypertension   . Hyperlipidemia   . Hypogonadism in male   . Pneumonia healthcare-associated  . Hyponatremia   . Fracture spine   AXIS IV:  other psychosocial or environmental problems, problems related to social environment and problems with primary support group AXIS V:  51-60 moderate symptoms  Plan:  Case discussed with Jeremiah Huh, NP  We will discontinue Lexapro secondary to hyponatremia Restart low dose of risperidone 1 mg twice daily and benztropine 0.5 mg twice daily as needed to prevent psychotic episodes and these 2 medication does not contributed to hyponatremia Supportive therapy provided about ongoing stressors.  Appreciate psychiatric consultation Please contact [phone] or [phone] if needs further assistance   Subjective:   Jeremiah [name] is a 59 y.o. male patient admitted with hyponatremia and history of schizophrenia.  HPI:  Admitting Jeremiah [name] is a 59 year old male seen, chart reviewed and case discussed with the release daily, NP for psychiatric consultation and evaluation of psychiatric medication management for schizophrenia and recent p finding of hyponatremia. Patient is a poor historian reported that he was admitted because of being paralyzed but unable to do more details. Most of the information obtained from review of medical records and discussed with the staff. Patient has denied suicidal, homicidal ideation, intention or plans. Patient does not appear to be responding to internal stimuli at this time.   Medical history:  Patient  With history of schizophrenia, hypertension and dyslipidemia. Brought to the ER after becoming agitated at the nursing facility. The patient's sister provides some history and reported the patient was recently transferred to the skilled nursing facility from his group home. Patient apparently fallen in October 2015 was hospitalized at [facility], C-spine at that time showed some type of abnormality but nonsurgical/conservative management was opted for. Since that time the patient apparently been wheelchair-bound. Patient also had a history of recent pneumonia in November 2015. Evaluated by the admitting physician in the ER patient was able to repeat his name and confirm he has a sister but he was otherwise very restless. Denied any pain. Chest x-ray revealed only emphysema. Labs revealed leukocytosis and severe hyponatremia. Urinalysis was unremarkable. Because of the severity of hyponatremia could care medicine was also consulted. Patient was in the ER for several hours and repeat laboratory data revealed slight improvement in sodium so no indication to admit patient in ICU.  Since admission patient's serologies seem consistent with SIADH: Low urine and serum osmolality, elevated joining sodium although not greater than 40. He was also found to have elevated CPK greater than 700 noting patient is on statin drug prior to admission. Unclear degree and severity of agitation at nursing home although as previously reported patient wheelchair bound so unclear if elevated CPK a result of musculoskeletal injury in setting of agitation.  HPI Elements:   Location:  Schizophrenia and hyponatremia, history of fall. Quality:  Poor with wheelchair-bound and dependent on other people. Severity:  Acute versus chronic. Timing:  Presented with the hyponatremia and generalized weakness. Duration:  Few weeks to  months. Context:  Needed appropriate medication management to avoid severe  hyponatremia.  Past Psychiatric History: Past Medical History  Diagnosis Date  . Paranoid schizophrenia   . Osteoporosis   . Respiratory failure hypoxic  . Vitamin D deficiency   . Hypertension   . Hyperlipidemia   . Hypogonadism in male   . Pneumonia healthcare-associated  . Hyponatremia   . Fracture spine    reports that he has quit smoking. He has never used smokeless tobacco. He reports that he does not drink alcohol or use illicit drugs. Family History  Problem Relation Age of Onset  . Family history unknown: Yes         Abuse/Neglect [facility]) Physical Abuse: Denies Verbal Abuse: Denies Sexual Abuse: Denies Allergies:  No Known Allergies  ACT Assessment Complete:  NO Objective: Blood pressure 132/81, pulse 104, temperature 98.9 F (37.2 C), temperature source Oral, resp. rate 19, height _0  (1.778 m), weight 65.318 kg (144 lb), SpO2 95 %.Body mass index is 20.66 kg/(m^2). Results for orders placed or performed during the hospital encounter of 10/20/14 (from the past 72 hour(s))  CBC with Differential     Status: Abnormal   Collection Time: 10/20/14  5:59 PM  Result Value Ref Range   WBC 16.2 (Jeremiah) 4.0 - 10.5 K/uL   RBC 4.22 4.22 - 5.81 MIL/uL   Hemoglobin 13.1 13.0 - 17.0 g/dL   HCT 35.5 (L) 39.0 - 52.0 %   MCV 84.1 78.0 - 100.0 fL   MCH 31.0 26.0 - 34.0 pg   MCHC 36.9 (Jeremiah) 30.0 - 36.0 g/dL   RDW 12.8 11.5 - 15.5 %   Platelets 368 150 - 400 K/uL   Neutrophils Relative % 82 (Jeremiah) 43 - 77 %   Lymphocytes Relative 9 (L) 12 - 46 %   Monocytes Relative 9 3 - 12 %   Eosinophils Relative 0 0 - 5 %   Basophils Relative 0 0 - 1 %   Neutro Abs 13.2 (Jeremiah) 1.7 - 7.7 K/uL   Lymphs Abs 1.5 0.7 - 4.0 K/uL   Monocytes Absolute 1.5 (Jeremiah) 0.1 - 1.0 K/uL   Eosinophils Absolute 0.0 0.0 - 0.7 K/uL   Basophils Absolute 0.0 0.0 - 0.1 K/uL   Smear Review MORPHOLOGY UNREMARKABLE   Comprehensive metabolic panel     Status: Abnormal   Collection Time: 10/20/14  5:59 PM  Result Value Ref  Range   Sodium 114 (LL) 135 - 145 mmol/L    Comment: Please note change in reference range. REPEATED TO VERIFY CRITICAL RESULT CALLED TO, READ BACK BY AND VERIFIED WITH: Jeremiah STEELE,RN 01.13.16 1854 M SHIPMAN    Potassium 3.8 3.5 - 5.1 mmol/L    Comment: Please note change in reference range.   Chloride 72 (L) 96 - 112 mEq/L   CO2 23 19 - 32 mmol/L   Glucose, Bld 134 (Jeremiah) 70 - 99 mg/dL   BUN 6 6 - 23 mg/dL   Creatinine, Ser 0.52 0.50 - 1.35 mg/dL   Calcium 9.3 8.4 - 10.5 mg/dL   Total Protein 6.5 6.0 - 8.3 g/dL   Albumin 3.5 3.5 - 5.2 g/dL   AST 42 (Jeremiah) 0 - 37 U/L   ALT 20 0 - 53 U/L   Alkaline Phosphatase 85 39 - 117 U/L   Total Bilirubin 0.8 0.3 - 1.2 mg/dL   GFR calc non Af Amer >90 >90 mL/min   GFR calc Af Amer >90 >90 mL/min    Comment: (NOTE)  The eGFR has been calculated using the CKD EPI equation. This calculation has not been validated in all clinical situations. eGFR's persistently <90 mL/min signify possible Chronic Kidney Disease.    Anion gap 19 (Jeremiah) 5 - 15  Urinalysis, Routine w reflex microscopic     Status: Abnormal   Collection Time: 10/20/14  6:27 PM  Result Value Ref Range   Color, Urine YELLOW YELLOW   APPearance HAZY (A) CLEAR   Specific Gravity, Urine 1.015 1.005 - 1.030   pH 7.5 5.0 - 8.0   Glucose, UA NEGATIVE NEGATIVE mg/dL   Hgb urine dipstick NEGATIVE NEGATIVE   Bilirubin Urine NEGATIVE NEGATIVE   Ketones, ur 15 (A) NEGATIVE mg/dL   Protein, ur 30 (A) NEGATIVE mg/dL   Urobilinogen, UA 0.2 0.0 - 1.0 mg/dL   Nitrite NEGATIVE NEGATIVE   Leukocytes, UA NEGATIVE NEGATIVE  Urine microscopic-add on     Status: None   Collection Time: 10/20/14  6:27 PM  Result Value Ref Range   Squamous Epithelial / LPF RARE RARE   WBC, UA 0-2 <3 WBC/hpf   RBC / HPF 0-2 <3 RBC/hpf   Bacteria, UA RARE RARE   Urine-Other AMORPHOUS URATES/PHOSPHATES   Osmolality     Status: Abnormal   Collection Time: 10/20/14  8:59 PM  Result Value Ref Range   Osmolality 238 (L) 275 -  300 mOsm/kg    Comment: Performed at [employer] [facility], urine     Status: Abnormal   Collection Time: 10/20/14  9:59 PM  Result Value Ref Range   Osmolality, Ur 342 (L) 390 - 1090 mOsm/kg    Comment: Performed at [facility]  Sodium, urine, random     Status: None   Collection Time: 10/20/14  9:59 PM  Result Value Ref Range   Sodium, Ur 22 mmol/L  TSH     Status: None   Collection Time: 10/20/14 10:10 PM  Result Value Ref Range   TSH 0.919 0.350 - 4.500 uIU/mL  Basic metabolic panel     Status: Abnormal   Collection Time: 10/20/14 10:10 PM  Result Value Ref Range   Sodium 118 (LL) 135 - 145 mmol/L    Comment: Please note change in reference range. REPEATED TO VERIFY CRITICAL RESULT CALLED TO, READ BACK BY AND VERIFIED WITH: Bonnell Public [id number] WILDERK    Potassium 3.5 3.5 - 5.1 mmol/L    Comment: Please note change in reference range.   Chloride 83 (L) 96 - 112 mEq/L    Comment: DELTA CHECK NOTED   CO2 26 19 - 32 mmol/L   Glucose, Bld 117 (Jeremiah) 70 - 99 mg/dL   BUN 8 6 - 23 mg/dL   Creatinine, Ser 0.48 (L) 0.50 - 1.35 mg/dL   Calcium 8.4 8.4 - 10.5 mg/dL   GFR calc non Af Amer >90 >90 mL/min   GFR calc Af Amer >90 >90 mL/min    Comment: (NOTE) The eGFR has been calculated using the CKD EPI equation. This calculation has not been validated in all clinical situations. eGFR's persistently <90 mL/min signify possible Chronic Kidney Disease.    Anion gap 9 5 - 15  Uric acid     Status: Abnormal   Collection Time: 10/20/14 10:39 PM  Result Value Ref Range   Uric Acid, Serum 3.0 (L) 4.0 - 7.8 mg/dL  Lactic acid, plasma     Status: None   Collection Time: 10/20/14 10:40 PM  Result Value Ref Range  Lactic Acid, Venous 0.9 0.5 - 2.2 mmol/L  MRSA PCR Screening     Status: None   Collection Time: 10/20/14 11:25 PM  Result Value Ref Range   MRSA by PCR NEGATIVE NEGATIVE    Comment:        The GeneXpert MRSA Assay (FDA approved for NASAL  specimens only), is one component of a comprehensive MRSA colonization surveillance program. It is not intended to diagnose MRSA infection nor to guide or monitor treatment for MRSA infections.   Basic metabolic panel     Status: Abnormal   Collection Time: 10/21/14  4:00 AM  Result Value Ref Range   Sodium 123 (L) 135 - 145 mmol/L    Comment: Please note change in reference range.   Potassium 3.5 3.5 - 5.1 mmol/L    Comment: Please note change in reference range.   Chloride 87 (L) 96 - 112 mEq/L   CO2 28 19 - 32 mmol/L   Glucose, Bld 114 (Jeremiah) 70 - 99 mg/dL   BUN 6 6 - 23 mg/dL   Creatinine, Ser 0.47 (L) 0.50 - 1.35 mg/dL   Calcium 8.7 8.4 - 10.5 mg/dL   GFR calc non Af Amer >90 >90 mL/min   GFR calc Af Amer >90 >90 mL/min    Comment: (NOTE) The eGFR has been calculated using the CKD EPI equation. This calculation has not been validated in all clinical situations. eGFR's persistently <90 mL/min signify possible Chronic Kidney Disease.    Anion gap 8 5 - 15  CBC with Differential     Status: Abnormal   Collection Time: 10/21/14  4:00 AM  Result Value Ref Range   WBC 11.2 (Jeremiah) 4.0 - 10.5 K/uL   RBC 4.02 (L) 4.22 - 5.81 MIL/uL   Hemoglobin 12.8 (L) 13.0 - 17.0 g/dL   HCT 34.8 (L) 39.0 - 52.0 %   MCV 86.6 78.0 - 100.0 fL   MCH 31.8 26.0 - 34.0 pg   MCHC 36.8 (Jeremiah) 30.0 - 36.0 g/dL   RDW 13.0 11.5 - 15.5 %   Platelets 325 150 - 400 K/uL   Neutrophils Relative % 78 (Jeremiah) 43 - 77 %   Neutro Abs 9.1 (Jeremiah) 1.7 - 7.7 K/uL   Lymphocytes Relative 7 (L) 12 - 46 %   Lymphs Abs 0.8 0.7 - 4.0 K/uL   Monocytes Relative 15 (Jeremiah) 3 - 12 %   Monocytes Absolute 1.7 (Jeremiah) 0.1 - 1.0 K/uL   Eosinophils Relative 0 0 - 5 %   Eosinophils Absolute 0.0 0.0 - 0.7 K/uL   Basophils Relative 0 0 - 1 %   Basophils Absolute 0.0 0.0 - 0.1 K/uL  Hepatic function panel     Status: Abnormal   Collection Time: 10/21/14  4:00 AM  Result Value Ref Range   Total Protein 5.9 (L) 6.0 - 8.3 g/dL   Albumin 3.1 (L) 3.5  - 5.2 g/dL   AST 44 (Jeremiah) 0 - 37 U/L   ALT 19 0 - 53 U/L   Alkaline Phosphatase 79 39 - 117 U/L   Total Bilirubin 0.7 0.3 - 1.2 mg/dL   Bilirubin, Direct 0.1 0.0 - 0.3 mg/dL   Indirect Bilirubin 0.6 0.3 - 0.9 mg/dL  CK     Status: Abnormal   Collection Time: 10/21/14  4:00 AM  Result Value Ref Range   Total CK 768 (Jeremiah) 7 - 232 U/L  Ammonia     Status: None   Collection Time: 10/21/14  7:03  AM  Result Value Ref Range   Ammonia 31 11 - 32 umol/L    Comment: Please note change in reference range.   Labs are reviewed and are pertinent for hyponatremia and other abnormal labs.  Current Facility-Administered Medications  Medication Dose Route Frequency Provider Last Rate Last Dose  . acetaminophen (TYLENOL) tablet 650 mg  650 mg Oral Q6H PRN Rise Patience, MD       Or  . acetaminophen (TYLENOL) suppository 650 mg  650 mg Rectal Q6H PRN Rise Patience, MD      . calcitonin (salmon) (MIACALCIN/FORTICAL) nasal spray 1 spray  1 spray Alternating Nares Daily Rise Patience, MD   1 spray at 10/21/14 0920  . enoxaparin (LOVENOX) injection 40 mg  40 mg Subcutaneous Daily Rise Patience, MD   40 mg at 10/21/14 0920  . LORazepam (ATIVAN) injection 1 mg  1 mg Intravenous Q4H PRN Rise Patience, MD      . LORazepam (ATIVAN) tablet 1 mg  1 mg Oral 3 times per day Rise Patience, MD   1 mg at 10/21/14 2878  . metoprolol (LOPRESSOR) injection 5 mg  5 mg Intravenous 3 times per day Samella Parr, NP      . ondansetron [facility]) tablet 4 mg  4 mg Oral Q6H PRN Rise Patience, MD       Or  . ondansetron [facility]) injection 4 mg  4 mg Intravenous Q6H PRN Rise Patience, MD      . sodium chloride 0.9 % injection 3 mL  3 mL Intravenous Q12H Rise Patience, MD   3 mL at 10/21/14 6767    Psychiatric Specialty Exam: Physical Exam as per history and physical   ROS none reported except feeling weakness and tired   Blood pressure 132/81, pulse 104, temperature 98.9 F  (37.2 C), temperature source Oral, resp. rate 19, height _0  (1.778 m), weight 65.318 kg (144 lb), SpO2 95 %.Body mass index is 20.66 kg/(m^2).  General Appearance: Disheveled and Guarded  Eye Contact::  Good  Speech:  Blocked and Slow  Volume:  Decreased  Mood:  Anxious  Affect:  Congruent and Depressed  Thought Process:  Disorganized  Orientation:  Full (Time, Place, and Person)  Thought Content:  WDL  Suicidal Thoughts:  No  Homicidal Thoughts:  No  Memory:  Immediate;   Poor Recent;   Poor  Judgement:  Poor  Insight:  Shallow  Psychomotor Activity:  Decreased  Concentration:  Poor  Recall:  Poor  Fund of Knowledge:Poor  Language: Fair  Akathisia:  Negative  Handed:  Right  AIMS (if indicated):     Assets:  Communication Skills Desire for Improvement Housing Intimacy Leisure Time  Sleep:      Musculoskeletal: Strength & Muscle Tone: decreased Gait & Station: unable to stand Patient leans: N/A  Treatment Plan Summary: Daily contact with patient to assess and evaluate symptoms and progress in treatment Medication management  We will discontinue Lexapro secondary to hyponatremia Restart low dose of risperidone 1 mg twice daily and benztropine 0.5 mg twice daily as needed to prevent psychotic episodes and these 2 medication does not contributed to hyponatremia  Arnoldo Hildreth,JANARDHAHA R. 10/21/2014 1:22 PM

## 2014-10-21 NOTE — Progress Notes (Signed)
Jeremiah [name]  Jeremiah [name] HWE:[id number] DOB: January 24, 1956 DOA: 10/20/2014 PCP: No primary care provider on file.   Brief narrative: 59 year old WM PMHx schizophrenia, hypertension and dyslipidemia. Brought to the ER after becoming agitated at the nursing facility. The patient's sister provides some history and reported the patient was recently transferred to the skilled nursing facility from his group home. Patient apparently fallen in October 2015 was hospitalized at [facility], C-spine at that time showed some type of abnormality but nonsurgical/conservative management was opted for. Since that time the patient apparently been wheelchair-bound. Patient also had a history of recent pneumonia in November 2015. Evaluated by the admitting physician in the ER patient was able to repeat his name and confirm he has a sister but he was otherwise very restless. Denied any pain. Chest x-ray revealed only emphysema. Labs revealed leukocytosis and severe hyponatremia. Urinalysis was unremarkable. Because of the severity of hyponatremia could care medicine was also consulted. Patient was in the ER for several hours and repeat laboratory data revealed slight improvement in sodium so no indication to admit patient in ICU.  Since admission patient's serologies seem consistent with SIADH: Low urine and serum osmolality, elevated joining sodium although not greater than 40. He was also found to have elevated CPK greater than 700 noting patient is on statin drug prior to admission. Unclear degree and severity of agitation at nursing home although as previously reported patient wheelchair bound so unclear if elevated CPK a result of musculoskeletal injury in setting of agitation.  HPI/Subjective:  Patient now awake. Denies any current pain or shortness of breath. 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    Fall/Elevated CPK -Could be secondary to muscle strain in setting of psychomotor agitation prior to admission but it has also been reported that patient is wheelchair-bound so doubt this is etiology -Suspect likely related to statin drug so this has been discontinued as of now. CK trending down -Repeat CPK in a.m. -Admission urinalysis with 15 ketones and 30 protein and no urobilinogen    Metabolic encephalopathy -Secondary to hyponatremia, resolving    Paranoid schizophrenia -Usual psych medications on hold due to suspected etiologies to hyponatremia -Psychiatry has been consulted to assist in medication adjustment; start Cogentin 0.5 mg BID -Ativan 1 mgTID -Risperidone 1 mg BID   History of cervical neck injury October 2015 -Prior to this was ambulatory but post injury wheelchair bound -On exam today seems to be moving all extremities without difficulty but exam limited -CT head/neck nondiagnostic see results below    Hyperlipidemia -Was on statin drug prior to admission which has now been placed on hold due to elevated CPK    Essential hypertension -Current blood pressure well controlled off medications -Was on beta blocker prior to admission and having some mild tachycardia so will order low-dose IV replacement   DVT prophylaxis: Lovenox Code Status: Full Family Communication: No family at bedside Disposition Plan/Expected LOS: Stepdown   Consultants: Dr.JONNALAGADDA,JANARDHAHA  (Psychiatry)  Procedures: 1/14 CT  head/C-spine;No evidence of acute  intracranial abnormality. -No acute osseous abnormality identified in the cervical spine  Cultures: None  Antibiotics: Was given one-time dose Rocephin and ER  Objective: Blood pressure 132/81, pulse 104, temperature 98.9 F (37.2 C), temperature source Oral, resp. rate 19, height 5\' 10"  (1.778 m), weight 144 lb (65.318 kg), SpO2 95 %.  Intake/Output Summary (Last 24 hours) at 10/21/14 1232 Last data filed at 10/21/14 1218  Gross per 24 hour  Intake    200 ml  Output   1775 ml  Net  -1575 ml     Exam: General: A/O 3 (does not know why), NAD, No acute respiratory distress Lungs: Clear to auscultation bilaterally without wheezes or crackles Cardiovascular: Regular rate and rhythm without murmur gallop or rub normal S1 and S2 Abdomen: Nontender, nondistended, soft, bowel sounds positive, no rebound, no ascites, no appreciable mass Extremities: No significant cyanosis, clubbing, or edema bilateral lower extremities  Scheduled Meds:  Scheduled Meds: . calcitonin (salmon)  1 spray Alternating Nares Daily  . enoxaparin (LOVENOX) injection  40 mg Subcutaneous Daily  . LORazepam  1 mg Oral 3 times per day  . metoprolol tartrate  25 mg Oral BID  . sodium chloride  3 mL Intravenous Q12H   Continuous Infusions:   Data Reviewed: Basic Metabolic Panel:  Recent Labs Lab 10/20/14 1759 10/20/14 2210 10/21/14 0400  NA 114* 118* 123*  K 3.8 3.5 3.5  CL 72* 83* 87*  CO2 23 26 28   GLUCOSE 134* 117* 114*  BUN 6 8 6   CREATININE 0.52 0.48* 0.47*  CALCIUM 9.3 8.4 8.7   Liver Function Tests:  Recent Labs Lab 10/20/14 1759 10/21/14 0400  AST 42* 44*  ALT 20 19  ALKPHOS 85 79  BILITOT 0.8 0.7  PROT 6.5 5.9*  ALBUMIN 3.5 3.1*   No results for input(s): LIPASE, AMYLASE in the last 168 hours.  Recent Labs Lab 10/21/14 0703  AMMONIA 31   CBC:  Recent Labs Lab 10/20/14 1759 10/21/14 0400  WBC 16.2* 11.2*  NEUTROABS 13.2* 9.1*  HGB 13.1 12.8*  HCT 35.5* 34.8*  MCV  84.1 86.6  PLT 368 325   Cardiac Enzymes:  Recent Labs Lab 10/21/14 0400  CKTOTAL 768*   BNP (last 3 results) No results for input(s): PROBNP in the last 8760 hours. CBG: No results for input(s): GLUCAP in the last 168 hours.  Recent Results (from the past 240 hour(s))  MRSA PCR Screening     Status: None   Collection Time: 10/20/14 11:25 PM  Result Value Ref Range Status   MRSA by PCR NEGATIVE NEGATIVE Final    Comment:        The GeneXpert MRSA Assay (FDA approved for NASAL specimens only), is one component of a comprehensive MRSA colonization surveillance program. It is not intended to diagnose MRSA infection nor to guide or monitor treatment for MRSA infections.      Studies:  Recent x-ray studies have been reviewed in detail by the Attending Physician  Time spent :      Erin Hearing, [place] [facility] Office  [phone] Pager [phone]   **If unable to reach the above provider after paging please contact the [place] @ [phone]  On-Call/Text Page:      [email or web address]      password [email or web address]  If 7PM-7AM, please contact night-coverage [email or web address] Password [email or web address] 10/21/2014, 12:32 PM   LOS: 1 day    Examined Patient and discussed A&P with ANP Ebony Hail and agree  with above plan. I have reviewed the entire database. I have made any necessary editorial changes, and agree with its content. Pt with Multiple Complex medical problems> 40 min spent in direct Pt care   Dia Crawford, MD [facility] [phone] pager

## 2014-10-21 NOTE — Progress Notes (Signed)
DR. Hal Hope notified of patient being uncooperative for CT scan. Patient trashing around on table after 0.5mg  of Ativan given IV. Will monitor

## 2014-10-21 NOTE — Progress Notes (Signed)
Pharmacy consulted to review medications PTA and list possible culprits for SIADH.  SSRI class of mediations  - Lexapro-is patient's home med - has risk for SIADH - reports with Zoloft are more common than with Lexapro.  Atypical antipsychotics - risperdal - is patient's home med - also has reports of [place]   Cogentin - also has ADE of agitation and delirium - part of CC and reason for ED visit - but used to decrease EPSE of antipsychotics   [name] Pharm.D. CPP, BCPS Clinical Pharmacist [phone] 10/21/2014 11:40 AM

## 2014-10-21 NOTE — H&P (Signed)
[facility] History and Physical  Jeremiah [name] QQV:[id number] DOB: 16-Oct-1955 DOA: 10/20/2014  Referring physician: ER physician. PCP: No primary care provider on file.   Chief Complaint: Agitation.  History obtained from patient's sister, ER physician and patient's nurse.  HPI: Jeremiah [name] is a 59 y.o. male with history of schizophrenia, hypertension and hyperlipidemia was brought to the ER after patient became agitated at his nursing home. As per patient's sister patient was recently transferred to nursing home from his group home. Patient was at [facility] when patient had a fall last October 2015 and at that time C-spine had shown some abnormality and patient and patient's sister had opted conservative management. Since then patient has been mostly wheelchair bound. And was recently transferred to nursing home after patient had a pneumonia in November 2015. Patient's sister is not aware if any new medications has been recently started. Patient was brought to the ER after patient became agitated. On exam patient presently is oriented to his name and does say that he has a sister. Follows commands but is very restless. Denies any pain. On exam patient does not have any neck rigidity and is afebrile. Patient's labs show leukocytosis and severe hyponatremia. Patient is also tachycardic. Chest x-ray shows emphysema and UA is not showing any definite signs of infection. Patient has been admitted for further management. Critical care also has been consulted.  Review of Systems: As presented in the history of presenting illness, rest negative.  Past Medical History  Diagnosis Date  . Paranoid schizophrenia   . Osteoporosis   . Respiratory failure hypoxic  . Vitamin D deficiency   . Hypertension   . Hyperlipidemia   . Hypogonadism in male   . Pneumonia healthcare-associated  . Hyponatremia   . Fracture spine   History reviewed. No pertinent past surgical  history. Social History:  reports that he has quit smoking. He has never used smokeless tobacco. He reports that he does not drink alcohol or use illicit drugs. Where does patient live nursing home. Can patient participate in ADLs? No.  No Known Allergies  Family History:  Family History  Problem Relation Age of Onset  . Family history unknown: Yes      Prior to Admission medications   Medication Sig Start Date End Date Taking? Authorizing Provider  benztropine (COGENTIN) 1 MG tablet Take 1 mg by mouth 2 (two) times daily.   Yes Historical Provider, MD  calcitonin, salmon, (MIACALCIN/FORTICAL) 200 UNIT/ACT nasal spray Place 1 spray into alternate nostrils daily.   Yes Historical Provider, MD  escitalopram (LEXAPRO) 20 MG tablet Take 20 mg by mouth daily.   Yes Historical Provider, MD  LORazepam (ATIVAN) 1 MG tablet Take 1 mg by mouth every 8 (eight) hours.   Yes Historical Provider, MD  metoprolol tartrate (LOPRESSOR) 25 MG tablet Take 25 mg by mouth 2 (two) times daily.   Yes Historical Provider, MD  risperiDONE (RISPERDAL) 2 MG tablet Take 2 mg by mouth 2 (two) times daily.   Yes Historical Provider, MD  risperiDONE (RISPERDAL) 3 MG tablet Take 3 mg by mouth at bedtime.   Yes Historical Provider, MD  simvastatin (ZOCOR) 20 MG tablet Take 20 mg by mouth at bedtime.   Yes Historical Provider, MD  clindamycin (CLEOCIN) 150 MG capsule Take 1 capsule (150 mg total) by mouth every 6 (six) hours. 09/04/14   Carlisle Cater, PA-C    Physical Exam: Filed Vitals:   10/20/14 2140 10/20/14 2145 10/20/14 2224  10/20/14 2333  BP: 152/87  152/87 162/99  Pulse: 56  116 120  Temp:    98.5 F (36.9 C)  TempSrc:    Oral  Resp: 31 23 20    Height:    5\' 10"  (1.778 m)  Weight:    65.318 kg (144 lb)  SpO2: 98%   100%     General:  Moderately built and nourished.  Eyes: Anicteric no pallor.  ENT: No discharge from the ears eyes nose or mouth.  Neck: No mass felt. No neck  rigidity.  Cardiovascular: S1 and S2 heard.  Respiratory: No rhonchi or crepitations.  Abdomen: Soft nontender bowel sounds present.  Skin: No rash.  Musculoskeletal: No edema.  Psychiatric: Patient is confused but follows commands.  Neurologic: Patient is confused and looks agitated but follows commands and is alert to his name and numbers about his sister. Moves all extremities.  Labs on Admission:  Basic Metabolic Panel:  Recent Labs Lab 10/20/14 1759 10/20/14 2210  NA 114* 118*  K 3.8 3.5  CL 72* 83*  CO2 23 26  GLUCOSE 134* 117*  BUN 6 8  CREATININE 0.52 0.48*  CALCIUM 9.3 8.4   Liver Function Tests:  Recent Labs Lab 10/20/14 1759  AST 42*  ALT 20  ALKPHOS 85  BILITOT 0.8  PROT 6.5  ALBUMIN 3.5   No results for input(s): LIPASE, AMYLASE in the last 168 hours. No results for input(s): AMMONIA in the last 168 hours. CBC:  Recent Labs Lab 10/20/14 1759  WBC 16.2*  NEUTROABS 13.2*  HGB 13.1  HCT 35.5*  MCV 84.1  PLT 368   Cardiac Enzymes: No results for input(s): CKTOTAL, CKMB, CKMBINDEX, TROPONINI in the last 168 hours.  BNP (last 3 results) No results for input(s): PROBNP in the last 8760 hours. CBG: No results for input(s): GLUCAP in the last 168 hours.  Radiological Exams on Admission: Dg Chest Port 1 View  10/20/2014   CLINICAL DATA:  Altered mental status for 1 day. History of hypertension and smoking. Initial encounter.  EXAM: PORTABLE CHEST - 1 VIEW  COMPARISON:  08/21/2014 and 08/19/2014 radiographs; CT 08/03/2014.  FINDINGS: 1827 hr. The heart size and mediastinal contours appear stable allowing for some patient rotation to the right. This is likely responsible for mild tracheal deviation to the right. The pulmonary edema noted previously has resolved. There is evidence of underlying mild emphysema. No superimposed airspace disease or pleural effusion identified. Advanced glenohumeral degenerative changes are present on the left. The  patient is status post right total shoulder arthroplasty.  IMPRESSION: No acute cardiopulmonary process.  Mild emphysema.   Electronically Signed   By: Camie Patience M.D.   On: 10/20/2014 18:52    EKG: Independently reviewed. Sinus tachycardia. With QTC of 483 ms.  Assessment/Plan Principal Problem:   Hyponatremia Active Problems:   Agitated   Paranoid schizophrenia   1. Severe hyponatremia - cause is not clear but given this patient's urine sodium is 22 and urine osmolality is 342 more than the serum osmolality and uric acid is low at this time patient may be having SIADH from patient's antipsychotic medications and we will be holding off it for now. Fluid restriction. Closely follow metabolic panel every 4 hourly. Need to consult psychiatry for further recommendations on patient's antipsychotic medications. 2. Agitation and acute encephalopathy - cause is not clear. Patient is afebrile. At this time I have placed patient on when necessary IV Ativan every 4 hourly for agitation. Holding off  antipsychotics secondary to severe hyponatremia see #1. Need to consult psychiatry in a.m. for further recommendations on patient's antipsychotic medications. CT head and neck is pending. Closely observe and stepdown. Encephalopathy also can be from patient's low sodium levels. Check urine drug screen. 3. Hypertension - continue patient's metoprolol. Closely observe blood pressure trends. 4. Hyperlipidemia - on statins. Check CK levels.  I did discuss with on-call pulmonary critical care Dr. Lake Bells. Please try to reach patient's nursing home in a.m. at [phone] to get more information on patient's medications specifically there was any changes or added.   DVT Prophylaxis SCDs in anticipation of possible procedures.  Code Status: Full code.  Family Communication: Patient's sister. Patient's sister can be reached at [phone]. Patient's sister is also the healthcare power of attorney. Disposition  Plan: Admit to inpatient.    Jeremiah [name] N. [facility] Pager [phone].  If 7PM-7AM, please contact night-coverage [email or web address] Password [email or web address] 10/21/2014, 1:40 AM

## 2014-10-21 NOTE — Care Management Note (Signed)
    Page 1 of 1   10/21/2014     8:41:10 AM CARE MANAGEMENT NOTE 10/21/2014  Patient:  Jeremiah [name], Jeremiah [name]   Account Number:  [id number]  Date Initiated:  10/21/2014  Documentation initiated by:  Elissa Hefty  Subjective/Objective Assessment:   adm w agitaiton, hyponatremia     Action/Plan:   from nsg facility   Anticipated DC Date:     Anticipated DC Plan:  [place] referral  Clinical Social Worker         Choice offered to / List presented to:             Status of service:   Medicare Important Message given?   (If response is "NO", the following Medicare IM given date fields will be blank) Date Medicare IM given:   Medicare IM given by:   Date Additional Medicare IM given:   Additional Medicare IM given by:    Discharge Disposition:    Per UR Regulation:  Reviewed for med. necessity/level of care/duration of stay  If discussed at [place] of Stay Meetings, dates discussed:    Comments:

## 2014-10-22 DIAGNOSIS — W19XXXA Unspecified fall, initial encounter: Secondary | ICD-10-CM | POA: Diagnosis present

## 2014-10-22 DIAGNOSIS — E785 Hyperlipidemia, unspecified: Secondary | ICD-10-CM

## 2014-10-22 DIAGNOSIS — G9341 Metabolic encephalopathy: Secondary | ICD-10-CM

## 2014-10-22 DIAGNOSIS — I10 Essential (primary) hypertension: Secondary | ICD-10-CM

## 2014-10-22 DIAGNOSIS — E222 Syndrome of inappropriate secretion of antidiuretic hormone: Principal | ICD-10-CM

## 2014-10-22 LAB — RAPID URINE DRUG SCREEN, HOSP PERFORMED
Amphetamines: NOT DETECTED
BARBITURATES: NOT DETECTED
Benzodiazepines: POSITIVE — AB
Cocaine: NOT DETECTED
OPIATES: NOT DETECTED
Tetrahydrocannabinol: NOT DETECTED

## 2014-10-22 LAB — BASIC METABOLIC PANEL
ANION GAP: 8 (ref 5–15)
BUN: 6 mg/dL (ref 6–23)
CALCIUM: 8.5 mg/dL (ref 8.4–10.5)
CO2: 26 mmol/L (ref 19–32)
CREATININE: 0.43 mg/dL — AB (ref 0.50–1.35)
Chloride: 91 mEq/L — ABNORMAL LOW (ref 96–112)
GFR calc Af Amer: >90 mL/min (ref >90)
Glucose, Bld: 86 mg/dL (ref 70–99)
Potassium: 3.3 mmol/L — ABNORMAL LOW (ref 3.5–5.1)
SODIUM: 125 mmol/L — AB (ref 135–145)

## 2014-10-22 LAB — CK: Total CK: 246 U/L — ABNORMAL HIGH (ref 7–232)

## 2014-10-22 MED ORDER — METOPROLOL TARTRATE 25 MG PO TABS
25.0000 mg | ORAL_TABLET | Freq: Two times a day (BID) | ORAL | Status: DC
  Administered 2014-10-22 – 2014-10-27 (×11): 25 mg via ORAL
  Filled 2014-10-22 (×14): qty 1

## 2014-10-22 MED ORDER — POTASSIUM CHLORIDE CRYS ER 20 MEQ PO TBCR
40.0000 meq | EXTENDED_RELEASE_TABLET | Freq: Once | ORAL | Status: AC
  Administered 2014-10-22: 40 meq via ORAL
  Filled 2014-10-22: qty 2

## 2014-10-22 NOTE — Progress Notes (Signed)
CSW (Clinical Education officer, museum) attempted to complete assessment with pt sister but unable to reach by phone. CSW left voicemail. CSW notified by [facility] that pt is long term care pt at facility and will need pasarr to be resubmitted. CSW submitted PASARR and awaiting manual review.  [place], [place]

## 2014-10-22 NOTE — Progress Notes (Signed)
Jeremiah [name] - Stepdown / ICU Progress Note  Jeremiah [name] BTD:[id number] DOB: 05-Oct-1956 DOA: [name]/13/2016 PCP: No primary care provider on file.   Brief narrative: 59 year old WM PMHx schizophrenia, hypertension and dyslipidemia. Brought to the ER after becoming agitated at the nursing facility. The patient's sister provides some history and reported the patient was recently transferred to the skilled nursing facility from his group home. Patient apparently fallen in October 2015 was hospitalized at [facility], C-spine at that time showed some type of abnormality but nonsurgical/conservative management was opted for. Since that time the patient apparently been wheelchair-bound. Patient also had a history of recent pneumonia in November 2015. Evaluated by the admitting physician in the ER patient was able to repeat his name and confirm he has a sister but he was otherwise very restless. Denied any pain. Chest x-ray revealed only emphysema. Labs revealed leukocytosis and severe hyponatremia. Urinalysis was unremarkable. Because of the severity of hyponatremia could care medicine was also consulted. Patient was in the ER for several hours and repeat laboratory data revealed slight improvement in sodium so no indication to admit patient in ICU.  Since admission patient's serologies seem consistent with SIADH: Low urine and serum osmolality, elevated joining sodium although not greater than 40. He was also found to have elevated CPK greater than 700 noting patient is on statin drug prior to admission. Unclear degree and severity of agitation at nursing home although as previously reported patient wheelchair bound so unclear if elevated CPK a result of musculoskeletal injury in setting of agitation.  Na has increased with fluid restriction and has increased after the addition of lower dose Risperdal; Psych MD recommeded starting SNRI (instead of SSRI) once Na corrected and follow Na closely.  CT of C spine and Head after admit show no acute changes. 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  -Suspect culprit psychotropic medications so placed on hold initially but Psych determined OK to use low dose Risperdal and slowly titrate up; later begin SNRI as opposed to preadmit SSRI -continue fluid restriction 1000 mL per 24 hours -Na has improved from 114 to 125 since admisison  Fall /Elevated CPK -Could be secondary to muscle strain in setting of psychomotor agitation prior to admission but it has also been reported that patient is wheelchair-bound so doubt this is etiology -Suspect likely related to statin drug so this has been discontinued as of now -Repeat CPK today has decreased from 768 to 246 -Admission urinalysis with 15 ketones and 30 protein and no urobilinogen    Metabolic encephalopathy -Secondary to hyponatremia, resolving    Paranoid schizophrenia -Usual psych medications on hold due to suspected etiologies to hyponatremia -Psychiatry has been consulted to assist in medication adjustment; start Cogentin 0.5 mg BID -Ativan [name] mgTID -Risperidone [name] mg BID   History of cervical neck injury October 2015 -Prior to this was ambulatory but post injury wheelchair bound -On exam today seems to be moving all extremities without difficulty but exam limited -Admitting physician ordered CT cervical spine and head: C spine stable with findings of 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 (compared to MRI 08/03/2014)- CT Head unremarkable -PT/OT evaluation    Hyperlipidemia -Was on statin drug prior to admission which has now been placed on hold due to elevated CPK which is trending down    Essential hypertension -BP trending up so dc IV Lopressor  and resume preadmit Lopressor  DVT prophylaxis: Lovenox Code Status: Full Family Communication: No family at  bedside Disposition Plan/Expected LOS: Transfer to floor- discharge after Na corrected AND stable with titration of psych meds   Consultants: Dr.JONNALAGADDA,Jeremiah [name] (Psychiatry)   Procedures: [name]/14 CT head/C-spine;No evidence of acute intracranial abnormality. -No acute osseous abnormality identified in the cervical spine  Cultures: None  Antibiotics: Was given one-time dose Rocephin and ER  Objective: Blood pressure 143/72, pulse 104, temperature 97.6 F (36.4 C), temperature source Oral, resp. rate 18, height 5\' 10"  ([name].778 m), weight 144 lb (65.318 kg), SpO2 94 %.  Intake/Output Summary (Last 24 hours) at 10/22/14 1312 Last data filed at 10/22/14 1200  Gross per 24 hour  Intake   1160 ml  Output   2625 ml  Net  -1465 ml     Exam: Gen: A/O 3 (does not know why), No acute respiratory distress; flat affect, not agitated Chest: Clear to auscultation bilaterally without wheezes, rhonchi or crackles, room air Cardiac: Regular rate and rhythm, S1-S2, no rubs murmurs or gallops, no peripheral edema, no JVD Abdomen: Soft nontender nondistended without obvious hepatosplenomegaly, no ascites Extremities: Symmetrical in appearance without cyanosis, clubbing or effusion  Scheduled Meds:  Scheduled Meds: . calcitonin (salmon)  [name] spray Alternating Nares Daily  . enoxaparin (LOVENOX) injection  40 mg Subcutaneous Daily  . LORazepam  [name] mg Oral 3 times per day  . metoprolol tartrate  25 mg Oral BID  . risperiDONE  [name] mg Oral BID  . sodium chloride  3 mL Intravenous Q12H   Continuous Infusions:   Data Reviewed: Basic Metabolic Panel:  Recent Labs Lab 10/20/14 1759 10/20/14 2210 10/21/14 0400 10/21/14 1342 10/22/14 0312  NA 114* 118* 123* 123* 125*  K 3.8 3.5 3.5 3.6 3.3*  CL 72* 83* 87* 88* 91*  CO2 23 26 28 28 26   GLUCOSE 134* 117* 114* 121* 86  BUN 6 8 6 7 6   CREATININE 0.52 0.48* 0.47* 0.54 0.43*  CALCIUM 9.3 8.4 8.7 8.3* 8.5   Liver Function Tests:  Recent  Labs Lab 10/20/14 1759 10/21/14 0400  AST 42* 44*  ALT 20 19  ALKPHOS 85 79  BILITOT 0.8 0.7  PROT 6.5 5.9*  ALBUMIN 3.5 3.[name]*   No results for input(s): LIPASE, AMYLASE in the last 168 hours.  Recent Labs Lab 10/21/14 0703  AMMONIA 31   CBC:  Recent Labs Lab 10/20/14 1759 10/21/14 0400  WBC 16.2* 11.2*  NEUTROABS 13.2* 9.[name]*  HGB 13.[name] 12.8*  HCT 35.5* 34.8*  MCV 84.[name] 86.6  PLT 368 325   Cardiac Enzymes:  Recent Labs Lab 10/21/14 0400 10/22/14 0312  CKTOTAL 768* 246*   BNP (last 3 results) No results for input(s): PROBNP in the last 8760 hours. CBG: No results for input(s): GLUCAP in the last 168 hours.  Recent Results (from the past 240 hour(s))  MRSA PCR Screening     Status: None   Collection Time: 10/20/14 11:25 PM  Result Value Ref Range Status   MRSA by PCR NEGATIVE NEGATIVE Final    Comment:        The GeneXpert MRSA Assay (FDA approved for NASAL specimens only), is one component of a comprehensive MRSA colonization surveillance program. It is not intended to diagnose MRSA infection nor to guide or monitor treatment for MRSA infections.      Studies:  Recent x-ray studies have been reviewed in detail by the Attending Physician  Time spent :  Erin Hearing, ANP [facility] Office  [phone] Pager [phone]   **If unable to reach the above provider after paging please contact the [place] @ [phone]  On-Call/Text Page:      [email or web address]      password [email or web address]  If 7PM-7AM, please contact night-coverage [email or web address] Password [email or web address] [name]/15/2016, [name]:12 PM   LOS: 2 days   Examined Patient and discussed A&P with ANP Ebony Hail and agree with above plan. I have reviewed the entire database. I have made any necessary editorial changes, and agree with its content. Pt with Multiple Complex medical problems> 40 min spent in direct Pt care   Dia Crawford, MD [facility] [phone] pager

## 2014-10-23 DIAGNOSIS — R748 Abnormal levels of other serum enzymes: Secondary | ICD-10-CM

## 2014-10-23 LAB — BASIC METABOLIC PANEL
ANION GAP: 10 (ref 5–15)
BUN: 9 mg/dL (ref 6–23)
CHLORIDE: 93 meq/L — AB (ref 96–112)
CO2: 26 mmol/L (ref 19–32)
Calcium: 8.8 mg/dL (ref 8.4–10.5)
Creatinine, Ser: 0.47 mg/dL — ABNORMAL LOW (ref 0.50–1.35)
GFR calc Af Amer: >90 mL/min (ref >90)
Glucose, Bld: 95 mg/dL (ref 70–99)
Potassium: 3.9 mmol/L (ref 3.5–5.1)
Sodium: 129 mmol/L — ABNORMAL LOW (ref 135–145)

## 2014-10-23 MED ORDER — BENZTROPINE MESYLATE 0.5 MG PO TABS
0.5000 mg | ORAL_TABLET | Freq: Two times a day (BID) | ORAL | Status: DC
  Administered 2014-10-23 – 2014-10-27 (×9): 0.5 mg via ORAL
  Filled 2014-10-23 (×10): qty 1

## 2014-10-23 MED ORDER — LORAZEPAM 1 MG PO TABS
1.0000 mg | ORAL_TABLET | Freq: Two times a day (BID) | ORAL | Status: DC
  Administered 2014-10-23 – 2014-10-27 (×8): 1 mg via ORAL
  Filled 2014-10-23 (×8): qty 1

## 2014-10-23 NOTE — Progress Notes (Signed)
OT Cancellation Note  Patient Details Name: Jeremiah [name] MRN: [id number] DOB: 03/19/1956   Cancelled Treatment:    Reason Eval/Treat Not Completed: Other (comment) Pt has Medicare and is from SNF with current D/C plan is SNF. No apparent immediate acute care OT needs, therefore will defer OT to SNF. If OT eval is needed please call Acute Rehab Dept. at [phone] or text page OT at [phone].    [place] Her[place] M   Cyndie Chime, OTR/L Occupational Therapist [phone] (pager)  10/23/2014, 10:47 AM

## 2014-10-23 NOTE — Evaluation (Signed)
Physical Therapy Evaluation and Discharge Patient Details Name: Jeremiah [name] MRN: [id number] DOB: 1955-11-07 Today's Date: 10/23/2014   History of Present Illness  Pt is a 59 y.o. male with history of paranoid schizophrenia, HTN and hyperlipidemia was brought to the ER after patient became agitated at his nursing home. As per patient's sister patient was recently transferred to nursing home from his group home. Patient was at [facility] when patient had a fall last October 2015 and at that time C-spine had shown some abnormality and patient and patient's sister had opted conservative management. Since then patient has been mostly wheelchair bound. And was recently transferred to nursing home after patient had a pneumonia in November 2015. Patient has been admitted for further management.  Clinical Impression  Patient evaluated by Physical Therapy with no further acute PT needs identified. All education has been completed and the patient has no further questions. At the time of PT eval pt was able to demonstrate transfers and ambulation at the modified independent to independent level.  Overall pt is a poor historian and did not provide many details other than "I was paralyzed but I started walking yesterday."   Per RN plan is to return to long term care, however if pt continues to mobilize well may be appropriate for transition back to a group home. See below for any follow-up Physial Therapy or equipment needs. PT is signing off. Thank you for this referral.      Follow Up Recommendations No PT follow up    Equipment Recommendations  Rolling walker with 5" wheels    Recommendations for Other Services       Precautions / Restrictions Precautions Precautions: Fall Restrictions Weight Bearing Restrictions: No      Mobility  Bed Mobility Overal bed mobility: Independent             General bed mobility comments: Pt adjusts position and crawls in/out bed  without assistance or use of bed rails.   Transfers Overall transfer level: Independent Equipment used: None             General transfer comment: Pt able to power-up to full standing position without assist and without any unsteadiness/LOB.   Ambulation/Gait Ambulation/Gait assistance: Modified independent (Device/Increase time) Ambulation Distance (Feet): 75 Feet Assistive device: Rolling walker (2 wheeled);None Gait Pattern/deviations: Step-through pattern;Decreased stride length;Trunk flexed Gait velocity: Decreased Gait velocity interpretation: Below normal speed for age/gender General Gait Details: Encouraged pt to use the RW for balance support as he has had a history of falls in the past. Observed pt also pushing w/c around room without difficulty, and ambulating short distances in room without any UE support. No unsteadiness or LOB noted.   Stairs            Information systems manager mobility: Yes Wheelchair propulsion: Both upper extremities;Both lower extermities Wheelchair parts: Independent Distance: 10  Modified Rankin (Stroke Patients Only)       Balance Overall balance assessment: History of Falls                                           Pertinent Vitals/Pain Pain Assessment: No/denies pain    Home Living Family/patient expects to be discharged to:: Skilled nursing facility (Per chart review, back to long term care facility)  Prior Function Level of Independence: Needs assistance   Gait / Transfers Assistance Needed: Per chart review, pt has been wheelchair bound since cervical fracture in October 2015. Pt states he was paralyzed however began to walk yesterday.  ADL's / Homemaking Assistance Needed: Unclear how much assistance pt requires for ADL's - he states that people help him get bathed and dressed.         Hand Dominance   Dominant Hand: Right     Extremity/Trunk Assessment   Upper Extremity Assessment: Overall WFL for tasks assessed           Lower Extremity Assessment: Difficult to assess due to impaired cognition;Overall WFL for tasks assessed (MMT testing difficult, however appeared [facility] during mobility)      Cervical / Trunk Assessment: Normal  Communication   Communication: No difficulties (Pt does not say much - overall poor historian)  Cognition Arousal/Alertness: Awake/alert Behavior During Therapy: Anxious Overall Cognitive Status: History of cognitive impairments - at baseline       Memory: Decreased short-term memory              General Comments      Exercises        Assessment/Plan    PT Assessment Patent does not need any further PT services  PT Diagnosis Abnormality of gait   PT Problem List    PT Treatment Interventions     PT Goals (Current goals can be found in the Care Plan section) Acute Rehab PT Goals Patient Stated Goal: Pt did not state goals PT Goal Formulation: All assessment and education complete, DC therapy    Frequency     Barriers to discharge        Co-evaluation               End of Session Equipment Utilized During Treatment: Gait belt Activity Tolerance: Patient tolerated treatment well Patient left: in bed;with call bell/phone within reach;with bed alarm set Nurse Communication: Mobility status         Time: 4481-8563 PT Time Calculation (min) (ACUTE ONLY): 12 min   Charges:   PT Evaluation $Initial PT Evaluation Tier I: 1 Procedure PT Treatments $Gait Training: 8-22 mins   PT G Codes:        Rolinda Roan 11/12/14, 10:58 AM   Rolinda Roan, PT, DPT Acute Rehabilitation Services Pager: [phone]

## 2014-10-23 NOTE — Progress Notes (Signed)
Progress Note  Jeremiah [name] BHA:[id number] DOB: 1956-07-13 DOA: 10/20/2014 PCP: No primary care provider on file.   Brief narrative:  59 year old WM PMHx schizophrenia, hypertension and dyslipidemia. Brought to the ER after becoming agitated at the nursing facility. The patient's sister provides some history and reported the patient was recently transferred to the skilled nursing facility from his group home. Patient apparently fallen in October 2015 was hospitalized at [facility], C-spine at that time showed some type of abnormality but nonsurgical/conservative management was opted for. Since that time the patient apparently been wheelchair-bound. Patient also had a history of recent pneumonia in November 2015. Evaluated by the admitting physician in the ER patient was able to repeat his name and confirm he has a sister but he was otherwise very restless. Denied any pain. Chest x-ray revealed only emphysema. Labs revealed leukocytosis and severe hyponatremia. Urinalysis was unremarkable. Because of the severity of hyponatremia could care medicine was also consulted. Patient was in the ER for several hours and repeat laboratory data revealed slight improvement in sodium so no indication to admit patient in ICU.  Since admission patient's serologies seem consistent with SIADH: Low urine and serum osmolality, elevated joining sodium although not greater than 40. He was also found to have elevated CPK greater than 700 noting patient is on statin drug prior to admission. Unclear degree and severity of agitation at nursing home although as previously reported patient wheelchair bound so unclear if elevated CPK a result of musculoskeletal injury in setting of agitation.  Na has increased with fluid restriction and has increased after the addition of lower dose Risperdal; Psych MD recommeded starting SNRI (instead of SSRI) once Na corrected and follow Na closely. CT of C spine and Head after  admit show no acute changes. PT/OT eval pending    HPI/Subjective: In bed, denies headache, chest pain denies any shortness of breath. No focal weakness. Mildly confused.    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 Continue fluid restriction monitor BMP.   Fall /Elevated CPK -Due to combination of fall and statin, trending down. Statin held.    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    Essential hypertension -stable on Lopressor    DVT prophylaxis: Lovenox Code Status: Full Family Communication: No family at bedside Disposition Plan/Expected LOS: SNF   Consultants: Dr.JONNALAGADDA,JANARDHAHA (Psychiatry)   Procedures: 1/14 CT head/C-spine;No evidence of acute intracranial abnormality. No acute osseous abnormality identified in the cervical spine  Cultures: None  Antibiotics: Was given one-time dose Rocephin and ER  Objective: Blood pressure 124/69, pulse 88, temperature 97.6 F (36.4 C), temperature source Oral, resp. rate 17, height 6\' 2"  (1.88 m), weight 70.308 kg (155 lb), SpO2 92 %.  Intake/Output  Summary (Last 24 hours) at 10/23/14 1018 Last data filed at 10/23/14 1002  Gross per 24 hour  Intake   1002 ml  Output   1300 ml  Net   -298 ml     Exam: Gen: A/O 3 (does not know why), No acute respiratory distress; flat affect, not agitated Chest: Clear to auscultation bilaterally without wheezes, rhonchi or crackles, room air Cardiac: Regular rate and rhythm, S1-S2, no rubs murmurs or gallops, no peripheral edema, no JVD Abdomen: Soft nontender nondistended without obvious hepatosplenomegaly, no ascites Extremities: Symmetrical in appearance without cyanosis, clubbing or effusion  Scheduled Meds:  Scheduled Meds: . benztropine  0.5 mg Oral BID  . calcitonin (salmon)  1 spray Alternating Nares Daily  . enoxaparin (LOVENOX) injection  40 mg Subcutaneous Daily  . LORazepam  1 mg Oral BID  . metoprolol tartrate  25 mg Oral BID  . risperiDONE  1 mg Oral BID  . sodium chloride  3 mL Intravenous Q12H   Continuous Infusions:   Data Reviewed: Basic Metabolic Panel:  Recent Labs Lab 10/20/14 2210 10/21/14 0400 10/21/14 1342 10/22/14 0312 10/23/14 0350  NA 118* 123* 123* 125* 129*  K 3.5 3.5 3.6 3.3* 3.9  CL 83* 87* 88* 91* 93*  CO2 26 28 28 26 26   GLUCOSE 117* 114* 121* 86 95  BUN 8 6 7 6 9   CREATININE 0.48* 0.47* 0.54 0.43* 0.47*  CALCIUM 8.4 8.7 8.3* 8.5 8.8   Liver Function Tests:  Recent Labs Lab 10/20/14 1759 10/21/14 0400  AST 42* 44*  ALT 20 19  ALKPHOS 85 79  BILITOT 0.8 0.7  PROT 6.5 5.9*  ALBUMIN 3.5 3.1*   No results for input(s): LIPASE, AMYLASE in the last 168 hours.  Recent Labs Lab 10/21/14 0703  AMMONIA 31   CBC:  Recent Labs Lab 10/20/14 1759 10/21/14 0400  WBC 16.2* 11.2*  NEUTROABS 13.2* 9.1*  HGB 13.1 12.8*  HCT 35.5* 34.8*  MCV 84.1 86.6  PLT 368 325   Cardiac Enzymes:  Recent Labs Lab 10/21/14 0400 10/22/14 0312  CKTOTAL 768* 246*   BNP (last 3 results) No results for input(s): PROBNP in the last 8760  hours. CBG: No results for input(s): GLUCAP in the last 168 hours.  Recent Results (from the past 240 hour(s))  MRSA PCR Screening     Status: None   Collection Time: 10/20/14 11:25 PM  Result Value Ref Range Status   MRSA by PCR NEGATIVE NEGATIVE Final    Comment:        The GeneXpert MRSA Assay (FDA approved for NASAL specimens only), is one component of a comprehensive MRSA colonization surveillance program. It is not intended to diagnose MRSA infection nor to guide or monitor treatment for MRSA infections.      Thurnell Lose M.D on 10/23/2014 at 10:18 AM  Between 7am to 7pm - Pager - [phone], After 7pm go to [email or web address] - password [email or web address]  And look for the night coverage person covering me after hours  [facility]  Office  [phone]

## 2014-10-23 NOTE — Clinical Social Work Note (Signed)
Clinical Social Work Department BRIEF PSYCHOSOCIAL ASSESSMENT 10/23/2014  Patient:  Jeremiah [name], Jeremiah [name]     Account Number:  [id number]     Admit date:  10/20/2014  Clinical Social Worker:  Glendon Axe, CLINICAL SOCIAL WORKER  Date/Time:  10/23/2014 01:31 PM  Referred by:  Physician  Date Referred:  10/23/2014 Referred for  SNF Placement   Other Referral:   Interview type:  Other - See comment Other interview type:   CSW spoke with pt's sister via telephone.    PSYCHOSOCIAL DATA Living Status:  FACILITY Admitted from facility:  [facility] Level of care:  [place] Primary support name:  Clary Meeker [phone] Primary support relationship to patient:  S[place] Degree of support available:   Strong    CURRENT CONCERNS Current Concerns  Post-Acute Placement   Other Concerns:    SOCIAL WORK ASSESSMENT / PLAN Clinical Social Worker spoke with pt's sister, Michiel Cowboy at [employer] in reference to post-acute placement/ pt's return to [facility]. CSW introduced CSW role and SNF process. Pt's sister reported pt was a resident of an ALF in [facility] but has transitioned to skilled care. Per pt's sister pt has been a resident at [facility] since late November 2015. Pt was just recently made a long-term resident at [facility] in December. Pt's sister reported that she has been attempting to obtain medical documents from last hospitalization. Pt's sister further reported she comes to visit pt often and is very involved in his care. CSW advised pt's sister to contact RN/MD for medical updates and provided contact information. Pt is currently awaiting Level II PASARR. FL-2 completed. CSW will continue to follow pt and pt's family for continued support and to facilitate pt's discharge needs once medically stable.   Assessment/plan status:  Psychosocial Support/Ongoing Assessment of Needs Other assessment/ plan:   Patient will return to [facility] once medically  stable.   Information/referral to community resources:   SNF information.    PATIENT'S/FAMILY'S RESPONSE TO PLAN OF CARE: Pt's sister pleasant and expressed her appreciation of social work intervention throughout the hosptial. Pt's sister very involved in pt's care and agreeable to pt's return to [facility]     Glendon Axe, MSW, LCSWA [phone] 10/23/2014 1:45 PM

## 2014-10-24 LAB — BASIC METABOLIC PANEL
ANION GAP: 10 (ref 5–15)
Anion gap: 6 (ref 5–15)
BUN: 7 mg/dL (ref 6–23)
BUN: 6 mg/dL (ref 6–23)
CALCIUM: 8.8 mg/dL (ref 8.4–10.5)
CHLORIDE: 94 meq/L — AB (ref 96–112)
CO2: 28 mmol/L (ref 19–32)
CO2: 25 mmol/L (ref 19–32)
CREATININE: 0.46 mg/dL — AB (ref 0.50–1.35)
CREATININE: 0.44 mg/dL — AB (ref 0.50–1.35)
Calcium: 8.5 mg/dL (ref 8.4–10.5)
Chloride: 94 mEq/L — ABNORMAL LOW (ref 96–112)
GFR calc Af Amer: >90 mL/min (ref >90)
GFR calc Af Amer: >90 mL/min (ref >90)
GFR calc non Af Amer: >90 mL/min (ref >90)
GFR calc non Af Amer: >90 mL/min (ref >90)
GLUCOSE: 99 mg/dL (ref 70–99)
GLUCOSE: 127 mg/dL — AB (ref 70–99)
Potassium: 3.5 mmol/L (ref 3.5–5.1)
Potassium: 3.4 mmol/L — ABNORMAL LOW (ref 3.5–5.1)
Sodium: 128 mmol/L — ABNORMAL LOW (ref 135–145)
Sodium: 129 mmol/L — ABNORMAL LOW (ref 135–145)

## 2014-10-24 LAB — URIC ACID, URINE, TIMED: Uric Acid, Urine: 40.6 mg/dL

## 2014-10-24 LAB — SODIUM, URINE, RANDOM: SODIUM UR: 39 mmol/L

## 2014-10-24 LAB — OSMOLALITY, URINE: Osmolality, Ur: 276 mOsm/kg — ABNORMAL LOW (ref 390–1090)

## 2014-10-24 LAB — CREATININE, URINE, RANDOM: Creatinine, Urine: 55.36 mg/dL

## 2014-10-24 LAB — OSMOLALITY: Osmolality: 262 mOsm/kg — ABNORMAL LOW (ref 275–300)

## 2014-10-24 LAB — URIC ACID: Uric Acid, Serum: 4.4 mg/dL (ref 4.0–7.8)

## 2014-10-24 MED ORDER — SODIUM CHLORIDE 0.9 % IV SOLN
INTRAVENOUS | Status: DC
  Administered 2014-10-24 – 2014-10-25 (×2): via INTRAVENOUS

## 2014-10-24 MED ORDER — SODIUM CHLORIDE 0.9 % IV SOLN
INTRAVENOUS | Status: AC
  Administered 2014-10-24: 10:00:00 via INTRAVENOUS

## 2014-10-24 NOTE — Progress Notes (Signed)
Progress Note  Jeremiah [name] JME:[id number] DOB: [birth year] DOA: 10/20/2014 PCP: No primary care provider on file.   Brief narrative:  59 year old WM PMHx schizophrenia, hypertension and dyslipidemia. Brought to the ER after becoming agitated at the nursing facility. The patient's sister provides some history and reported the patient was recently transferred to the skilled nursing facility from his group home. Patient apparently fallen in October 2015 was hospitalized at [facility], C-spine at that time showed some type of abnormality but nonsurgical/conservative management was opted for. Since that time the patient apparently been wheelchair-bound. Patient also had a history of recent pneumonia in November 2015. Evaluated by the admitting physician in the ER patient was able to repeat his name and confirm he has a sister but he was otherwise very restless. Denied any pain. Chest x-ray revealed only emphysema. Labs revealed leukocytosis and severe hyponatremia. Urinalysis was unremarkable. Because of the severity of hyponatremia could care medicine was also consulted. Patient was in the ER for several hours and repeat laboratory data revealed slight improvement in sodium so no indication to admit patient in ICU.  Since admission patient's serologies seem consistent with SIADH: Low urine and serum osmolality, elevated joining sodium although not greater than 40. He was also found to have elevated CPK greater than 700 noting patient is on statin drug prior to admission. Unclear degree and severity of agitation at nursing home although as previously reported patient wheelchair bound so unclear if elevated CPK a result of musculoskeletal injury in setting of agitation.  Na has increased with fluid restriction and has increased after the addition of lower dose Risperdal; Psych MD recommeded starting SNRI (instead of SSRI) once Na corrected and follow Na closely. CT of C spine and Head after  admit show no acute changes. PT/OT eval pending    HPI/Subjective:  In bed, denies headache, chest pain denies any shortness of breath. No focal weakness. Mildly confused.    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 Now clinically appears dehydrated and sodium falling despite fluid restriction, discussed with nephrologist Dr Jonnie Finner on 10/24/2014, challenge with fluids and monitor BMP.    Fall /Elevated CPK -Due to combination of fall and statin, trending down. Statin held.    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    Essential hypertension -stable on Lopressor    DVT prophylaxis: Lovenox Code Status: Full Family Communication: No family at bedside Disposition Plan/Expected LOS: SNF   Consultants: Dr.JONNALAGADDA,JANARDHAHA (Psychiatry)   Procedures: 1/14 CT head/C-spine;No evidence of acute intracranial abnormality. No acute osseous abnormality identified in the cervical spine  Cultures: None  Antibiotics: Was given one-time dose Rocephin and ER  Objective: Blood pressure 128/78, pulse  90, temperature 97.6  F (36.4 C), temperature source Oral, resp. rate 16, height 6\' 2"  (1.88 m), weight 70.308 kg (155 lb), SpO2 95 %.  Intake/Output Summary (Last 24 hours) at 10/24/14 1010 Last data filed at 10/24/14 3154  Gross per 24 hour  Intake    870 ml  Output   2300 ml  Net  -1430 ml     Exam: Gen: A/O 3 (does not know why), No acute respiratory distress; flat affect, not agitated Chest: Clear to auscultation bilaterally without wheezes, rhonchi or crackles, room air Cardiac: Regular rate and rhythm, S1-S2, no rubs murmurs or gallops, no peripheral edema, no JVD Abdomen: Soft nontender nondistended without obvious hepatosplenomegaly, no ascites Extremities: Symmetrical in appearance without cyanosis, clubbing or effusion  Scheduled Meds:  Scheduled Meds: . benztropine  0.5 mg Oral BID  . calcitonin (salmon)  1 spray Alternating Nares Daily  . enoxaparin (LOVENOX) injection  40 mg Subcutaneous Daily  . LORazepam  1 mg Oral BID  . metoprolol tartrate  25 mg Oral BID  . risperiDONE  1 mg Oral BID  . sodium chloride  3 mL Intravenous Q12H   Continuous Infusions: . sodium chloride      Data Reviewed: Basic Metabolic Panel:  Recent Labs Lab 10/21/14 0400 10/21/14 1342 10/22/14 0312 10/23/14 0350 10/24/14 0340  NA 123* 123* 125* 129* 128*  K 3.5 3.6 3.3* 3.9 3.5  CL 87* 88* 91* 93* 94*  CO2 28 28 26 26 28   GLUCOSE 114* 121* 86 95 99  BUN 6 7 6 9 7   CREATININE 0.47* 0.54 0.43* 0.47* 0.46*  CALCIUM 8.7 8.3* 8.5 8.8 8.8   Liver Function Tests:  Recent Labs Lab 10/20/14 1759 10/21/14 0400  AST 42* 44*  ALT 20 19  ALKPHOS 85 79  BILITOT 0.8 0.7  PROT 6.5 5.9*  ALBUMIN 3.5 3.1*   No results for input(s): LIPASE, AMYLASE in the last 168 hours.  Recent Labs Lab 10/21/14 0703  AMMONIA 31   CBC:  Recent Labs Lab 10/20/14 1759 10/21/14 0400  WBC 16.2* 11.2*  NEUTROABS 13.2* 9.1*  HGB 13.1 12.8*  HCT 35.5* 34.8*  MCV 84.1 86.6  PLT 368 325    Cardiac Enzymes:  Recent Labs Lab 10/21/14 0400 10/22/14 0312  CKTOTAL 768* 246*   BNP (last 3 results) No results for input(s): PROBNP in the last 8760 hours. CBG: No results for input(s): GLUCAP in the last 168 hours.  Recent Results (from the past 240 hour(s))  MRSA PCR Screening     Status: None   Collection Time: 10/20/14 11:25 PM  Result Value Ref Range Status   MRSA by PCR NEGATIVE NEGATIVE Final    Comment:        The GeneXpert MRSA Assay (FDA approved for NASAL specimens only), is one component of a comprehensive MRSA colonization surveillance program. It is not intended to diagnose MRSA infection nor to guide or monitor treatment for MRSA infections.      Thurnell Lose M.D on 10/24/2014 at 10:10 AM  Between 7am to 7pm - Pager - [phone], After 7pm go to [email or web address] - password [email or web address]  And look for the night coverage person covering me after hours  [facility]  Office  [phone]

## 2014-10-25 LAB — BASIC METABOLIC PANEL
Anion gap: 7 (ref 5–15)
BUN: <5 mg/dL — ABNORMAL LOW (ref 6–23)
CHLORIDE: 96 meq/L (ref 96–112)
CO2: 26 mmol/L (ref 19–32)
Calcium: 8.8 mg/dL (ref 8.4–10.5)
Creatinine, Ser: 0.47 mg/dL — ABNORMAL LOW (ref 0.50–1.35)
GFR calc non Af Amer: >90 mL/min (ref >90)
Glucose, Bld: 100 mg/dL — ABNORMAL HIGH (ref 70–99)
POTASSIUM: 3.4 mmol/L — AB (ref 3.5–5.1)
Sodium: 129 mmol/L — ABNORMAL LOW (ref 135–145)

## 2014-10-25 MED ORDER — MAGNESIUM SULFATE IN D5W 10-5 MG/ML-% IV SOLN
1.0000 g | Freq: Once | INTRAVENOUS | Status: AC
  Administered 2014-10-25: 1 g via INTRAVENOUS
  Filled 2014-10-25: qty 100

## 2014-10-25 MED ORDER — NIACIN 100 MG PO TABS: 100.0000 mg | ORAL_TABLET | Freq: Every day | ORAL | Status: DC

## 2014-10-25 MED ORDER — SODIUM CHLORIDE 0.9 % IV SOLN: INTRAVENOUS | Status: DC

## 2014-10-25 MED ORDER — POTASSIUM CHLORIDE CRYS ER 20 MEQ PO TBCR
40.0000 meq | EXTENDED_RELEASE_TABLET | ORAL | Status: AC
  Administered 2014-10-25: 20 meq via ORAL
  Administered 2014-10-25: 40 meq via ORAL
  Filled 2014-10-25 (×2): qty 2

## 2014-10-25 MED ORDER — LORAZEPAM 0.5 MG PO TABS: 1.0000 mg | ORAL_TABLET | Freq: Three times a day (TID) | ORAL | Status: DC | PRN

## 2014-10-25 MED ORDER — BENZTROPINE MESYLATE 0.5 MG PO TABS: 0.5000 mg | ORAL_TABLET | Freq: Two times a day (BID) | ORAL | Status: DC

## 2014-10-25 MED ORDER — RISPERIDONE 1 MG PO TABS: 1.0000 mg | ORAL_TABLET | Freq: Two times a day (BID) | ORAL | Status: DC

## 2014-10-25 NOTE — Discharge Instructions (Signed)
Follow with Primary MD No primary care provider on file. in 7 days   Follow BMP 2/week for 2 weeks   Activity: As tolerated with Full fall precautions use walker/cane & assistance as needed   Disposition SNF   Diet: Heart Healthy with feeding assistance and aspiration precautions as needed. Strict 1.2 lit/day fluid restriction.   On your next visit with your primary care physician please Get Medicines reviewed and adjusted.   Please request your Prim.MD to go over all Hospital Tests and Procedure/Radiological results at the follow up, please get all Hospital records sent to your Prim MD by signing hospital release before you go home.   If you experience worsening of your admission symptoms, develop shortness of breath, life threatening emergency, suicidal or homicidal thoughts you must seek medical attention immediately by calling 911 or calling your MD immediately  if symptoms less severe.  You Must read complete instructions/literature along with all the possible adverse reactions/side effects for all the Medicines you take and that have been prescribed to you. Take any new Medicines after you have completely understood and accpet all the possible adverse reactions/side effects.   Do not drive, operating heavy machinery, perform activities at heights, swimming or participation in water activities or provide baby sitting services if your were admitted for syncope or siezures until you have seen by Primary MD or a Neurologist and advised to do so again.  Do not drive when taking Pain medications.    Do not take more than prescribed Pain, Sleep and Anxiety Medications  Special Instructions: If you have smoked or chewed Tobacco  in the last 2 yrs please stop smoking, stop any regular Alcohol  and or any Recreational drug use.  Wear Seat belts while driving.   Please note  You were cared for by a hospitalist during your hospital stay. If you have any questions about your discharge  medications or the care you received while you were in the hospital after you are discharged, you can call the unit and asked to speak with the hospitalist on call if the hospitalist that took care of you is not available. Once you are discharged, your primary care physician will handle any further medical issues. Please note that NO REFILLS for any discharge medications will be authorized once you are discharged, as it is imperative that you return to your primary care physician (or establish a relationship with a primary care physician if you do not have one) for your aftercare needs so that they can reassess your need for medications and monitor your lab values.

## 2014-10-25 NOTE — Clinical Social Work Note (Signed)
Patient evaluated by PASARR representative on 10/25/2014 at bedside for SNF placement at time of discharge. [place] office closed for holiday on 10/25/2014. PASARR representative to complete review on 10/26/2014.  Patient to be discharged to [facility] once PASARR received. MD, RN, and [facility] admissions liaison updated regarding discharge.  Barriers to discharge: PASARR not complete.  Lubertha Sayres, [place] ([phone]) Licensed Clinical Social Worker Orthopedics [phone]) and Surgical [phone])

## 2014-10-25 NOTE — Discharge Summary (Addendum)
Jeremiah [name], is a 59 y.o. male  DOB 1955/11/10  MRN [id number].  Admission date:  10/20/2014  Admitting Physician  Rise Patience, MD  Discharge Date:  10/27/2014   Primary MD  No primary care provider on file.  Recommendations for primary care physician for things to follow:   Monitor BMP twice a week for 2 weeks.   Admission Diagnosis  Hyponatremia [E87.1] Paranoid schizophrenia [F20.0] Agitated [R45.1]   Discharge Diagnosis  Hyponatremia [E87.1] Paranoid schizophrenia [F20.0] Agitated [R45.1]     Principal Problem:   SIADH  Active Problems:   Hyponatremia   Metabolic encephalopathy   Paranoid schizophrenia   Hyperlipidemia   Essential hypertension   Elevated CPK   Fall      Past Medical History  Diagnosis Date  . Paranoid schizophrenia   . Osteoporosis   . Respiratory failure hypoxic  . Vitamin D deficiency   . Hypertension   . Hyperlipidemia   . Hypogonadism in male   . Pneumonia healthcare-associated  . Hyponatremia   . Fracture spine    Past Surgical History  Procedure Laterality Date  . No past surgeries         History of present illness and  Hospital Course:     Kindly see H&P for history of present illness and admission details, please review complete Labs, Consult reports and Test reports for all details in brief  HPI  from the history and physical done on the day of admission  59 year old WM PMHx schizophrenia, hypertension and dyslipidemia. Brought to the ER after becoming agitated at the nursing facility. The patient's sister provides some history and reported the patient was recently transferred to the skilled nursing facility from his group home. Patient apparently fallen in October 2015 was hospitalized at [facility], C-spine at that time showed some type of  abnormality but nonsurgical/conservative management was opted for. Since that time the patient apparently been wheelchair-bound. Patient also had a history of recent pneumonia in November 2015. Evaluated by the admitting physician in the ER patient was able to repeat his name and confirm he has a sister but he was otherwise very restless. Denied any pain. Chest x-ray revealed only emphysema. Labs revealed leukocytosis and severe hyponatremia. Urinalysis was unremarkable. Because of the severity of hyponatremia could care medicine was also consulted. Patient was in the ER for several hours and repeat laboratory data revealed slight improvement in sodium so no indication to admit patient in ICU.   Since admission patient's serologies seem consistent with SIADH: Low urine and serum osmolality, elevated joining sodium although not greater than 40. He was also found to have elevated CPK greater than 700 noting patient is on statin drug prior to admission. Unclear degree and severity of agitation at nursing home although as previously reported patient wheelchair bound so unclear if elevated CPK a result of musculoskeletal injury in setting of agitation or statins.   Hospital Course    Acute Hyponatremia/presumed SIADH  -Initially likely was SIADH from psych meds, medications  adjusted by sodium improved with medication changes and fluid restriction. Request the facility M.D. to monitor BMP twice a week for 2 weeks. Continue total fluid restriction at 1.2 L a day.    Fall /Elevated CPK -Due to combination of fall and statin, trending down. Statin held.   Metabolic encephalopathy -Secondary to hyponatremia, resolving, likely close to baseline.   Paranoid schizophrenia -Usual psych medications on hold due to suspected etiologies to hyponatremia -Psychiatry has been consulted to assist in medication adjustment; medications have been adjusted as below. He stable. Will require continued psychiatry  follow-up at the facility.  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   Essential hypertension -stable on Lopressor   Discharge Condition: Stable   Follow UP  Follow-up Information    Follow up with SNF MD and Psychiatrist. Schedule an appointment as soon as possible for a visit in 3 days.        Discharge Instructions  and  Discharge Medications          Discharge Instructions    Discharge instructions    Complete by:  As directed   Follow with Primary MD No primary care provider on file. in 7 days   Follow BMP 2/week for 2 weeks   Activity: As tolerated with Full fall precautions use walker/cane & assistance as needed   Disposition SNF   Diet: Heart Healthy with feeding assistance and aspiration precautions as needed. Strict 1.2 lit/day fluid restriction.   On your next visit with your primary care physician please Get Medicines reviewed and adjusted.   Please request your Prim.MD to go over all Hospital Tests and Procedure/Radiological results at the follow up, please get all Hospital records sent to your Prim MD by signing hospital release before you go home.   If you experience worsening of your admission symptoms, develop shortness of breath, life threatening emergency, suicidal or homicidal thoughts you must seek medical attention immediately by calling 911 or calling your MD immediately  if symptoms less severe.  You Must read complete instructions/literature along with all the possible adverse reactions/side effects for all the  Medicines you take and that have been prescribed to you. Take any new Medicines after you have completely understood and accpet all the possible adverse reactions/side effects.   Do not drive, operating heavy machinery, perform activities at heights, swimming or participation in water activities or provide baby sitting services if your were admitted for syncope or siezures until you have seen by Primary MD or a Neurologist and advised to do so again.  Do not drive when taking Pain medications.    Do not take more than prescribed Pain, Sleep and Anxiety Medications  Special Instructions: If you have smoked or chewed Tobacco  in the last 2 yrs please stop smoking, stop any regular Alcohol  and or any Recreational drug use.  Wear Seat belts while driving.   Please note  You were cared for by a hospitalist during your hospital stay. If you have any questions about your discharge medications or the care you received while you were in the hospital after you are discharged,  you can call the unit and asked to speak with the hospitalist on call if the hospitalist that took care of you is not available. Once you are discharged, your primary care physician will handle any further medical issues. Please note that NO REFILLS for any discharge medications will be authorized once you are discharged, as it is imperative that you return to your primary care physician (or establish a relationship with a primary care physician if you do not have one) for your aftercare needs so that they can reassess your need for medications and monitor your lab values.     Increase activity slowly    Complete by:  As directed             Medication List    STOP taking these medications        clindamycin 150 MG capsule  Commonly known as:  CLEOCIN     escitalopram 20 MG tablet  Commonly known as:  LEXAPRO     simvastatin 20 MG tablet  Commonly known as:  ZOCOR      TAKE these medications        benztropine 0.5  MG tablet  Commonly known as:  COGENTIN  Take 1 tablet (0.5 mg total) by mouth 2 (two) times daily.     calcitonin (salmon) 200 UNIT/ACT nasal spray  Commonly known as:  MIACALCIN/FORTICAL  Place 1 spray into alternate nostrils daily.     LORazepam 0.5 MG tablet  Commonly known as:  ATIVAN  Take 2 tablets (1 mg total) by mouth every 8 (eight) hours as needed for anxiety.     metoprolol tartrate 25 MG tablet  Commonly known as:  LOPRESSOR  Take 25 mg by mouth 2 (two) times daily.     niacin 100 MG tablet  Take 1 tablet (100 mg total) by mouth at bedtime.     risperiDONE 1 MG tablet  Commonly known as:  RISPERDAL  Take 1 tablet (1 mg total) by mouth 2 (two) times daily.          Diet and Activity recommendation: See Discharge Instructions above   Consults obtained - Psych   Major procedures and Radiology Reports - PLEASE review detailed and final reports for all details, in brief -       Ct Head Wo Contrast  10/21/2014   CLINICAL DATA:  Fall.  Initial encounter.  EXAM: CT HEAD WITHOUT CONTRAST  CT CERVICAL SPINE WITHOUT CONTRAST  TECHNIQUE: Multidetector CT imaging of the head and cervical spine was performed following the standard protocol without intravenous contrast. Multiplanar CT image reconstructions of the cervical spine were also generated.  COMPARISON:  Head CT 08/19/2014.  Cervical spine MRI 08/03/2014.  FINDINGS: CT HEAD FINDINGS  There is no evidence of acute cortical infarct, intracranial hemorrhage, mass, midline shift, or extra-axial fluid collection. Mild cerebral atrophy is unchanged. Orbits are unremarkable. Mastoid air cells are clear. Trace left frontal sinus mucosal thickening is unchanged. No skull fracture is identified.  CT CERVICAL SPINE FINDINGS  Vertebral alignment is unchanged without listhesis. The bones are diffusely osteopenic. No cervical spine fracture is identified. Lucent lesion with suggestion of coarse trabeculations in the left C7 articular  pillar may represent a hemangioma as described on prior MRI. Mild disc space narrowing is present C5-6. Focal ossification is noted in the nuchal ligament at the C5 level. 4.2 x 3.5 cm sebaceous cyst is noted in the posterior neck. The visualized lung apices are clear. Moderate carotid bifurcation calcification  is noted bilaterally.  IMPRESSION: 1. No evidence of acute intracranial abnormality. 2. No acute osseous abnormality identified in the cervical spine.   Electronically Signed   By: Logan Bores   On: 10/21/2014 15:58   Ct Cervical Spine Wo Contrast  10/21/2014   CLINICAL DATA:  Fall.  Initial encounter.  EXAM: CT HEAD WITHOUT CONTRAST  CT CERVICAL SPINE WITHOUT CONTRAST  TECHNIQUE: Multidetector CT imaging of the head and cervical spine was performed following the standard protocol without intravenous contrast. Multiplanar CT image reconstructions of the cervical spine were also generated.  COMPARISON:  Head CT 08/19/2014.  Cervical spine MRI 08/03/2014.  FINDINGS: CT HEAD FINDINGS  There is no evidence of acute cortical infarct, intracranial hemorrhage, mass, midline shift, or extra-axial fluid collection. Mild cerebral atrophy is unchanged. Orbits are unremarkable. Mastoid air cells are clear. Trace left frontal sinus mucosal thickening is unchanged. No skull fracture is identified.  CT CERVICAL SPINE FINDINGS  Vertebral alignment is unchanged without listhesis. The bones are diffusely osteopenic. No cervical spine fracture is identified. Lucent lesion with suggestion of coarse trabeculations in the left C7 articular pillar may represent a hemangioma as described on prior MRI. Mild disc space narrowing is present C5-6. Focal ossification is noted in the nuchal ligament at the C5 level. 4.2 x 3.5 cm sebaceous cyst is noted in the posterior neck. The visualized lung apices are clear. Moderate carotid bifurcation calcification is noted bilaterally.  IMPRESSION: 1. No evidence of acute intracranial  abnormality. 2. No acute osseous abnormality identified in the cervical spine.   Electronically Signed   By: Logan Bores   On: 10/21/2014 15:58   Dg Chest Port 1 View  10/20/2014   CLINICAL DATA:  Altered mental status for 1 day. History of hypertension and smoking. Initial encounter.  EXAM: PORTABLE CHEST - 1 VIEW  COMPARISON:  08/21/2014 and 08/19/2014 radiographs; CT 08/03/2014.  FINDINGS: 1827 hr. The heart size and mediastinal contours appear stable allowing for some patient rotation to the right. This is likely responsible for mild tracheal deviation to the right. The pulmonary edema noted previously has resolved. There is evidence of underlying mild emphysema. No superimposed airspace disease or pleural effusion identified. Advanced glenohumeral degenerative changes are present on the left. The patient is status post right total shoulder arthroplasty.  IMPRESSION: No acute cardiopulmonary process.  Mild emphysema.   Electronically Signed   By: Camie Patience M.D.   On: 10/20/2014 18:52    Micro Results      Recent Results (from the past 240 hour(s))  MRSA PCR Screening     Status: None   Collection Time: 10/20/14 11:25 PM  Result Value Ref Range Status   MRSA by PCR NEGATIVE NEGATIVE Final    Comment:        The GeneXpert MRSA Assay (FDA approved for NASAL specimens only), is one component of a comprehensive MRSA colonization surveillance program. It is not intended to diagnose MRSA infection nor to guide or monitor treatment for MRSA infections.        Today   Subjective:    Jeremiah [name] today has no headache,no chest abdominal pain,no new weakness tingling or numbness, feels much better.   Objective:   Blood pressure 107/61, pulse 86, temperature 98.6 F (37 C), temperature source Oral, resp. rate 16, height 6\' 2"  (1.88 m), weight 70.308 kg (155 lb), SpO2 98 %.   Intake/Output Summary (Last 24 hours) at 10/27/14 1028 Last data filed at 10/27/14 1660  Gross  per 24  hour  Intake    240 ml  Output   1425 ml  Net  -1185 ml    Exam Awake , answers basic questions, No new F.N deficits, Normal affect [place].AT,PERRAL Supple Neck,No JVD, No cervical lymphadenopathy appriciated.  Symmetrical Chest wall movement, Good air movement bilaterally, CTAB RRR,No Gallops,Rubs or new Murmurs, No Parasternal Heave +ve B.Sounds, Abd Soft, Non tender, No organomegaly appriciated, No rebound -guarding or rigidity. No Cyanosis, Clubbing or edema, No new Rash or bruise  Data Review   CBC w Diff:  Lab Results  Component Value Date   WBC 11.2* 10/21/2014   HGB 12.8* 10/21/2014   HCT 34.8* 10/21/2014   PLT 325 10/21/2014   LYMPHOPCT 7* 10/21/2014   MONOPCT 15* 10/21/2014   EOSPCT 0 10/21/2014   BASOPCT 0 10/21/2014    CMP:  Lab Results  Component Value Date   NA 131* 10/26/2014   K 4.0 10/26/2014   CL 99 10/26/2014   CO2 22 10/26/2014   BUN 6 10/26/2014   CREATININE 0.53 10/26/2014   PROT 5.9* 10/21/2014   ALBUMIN 3.1* 10/21/2014   BILITOT 0.7 10/21/2014   ALKPHOS 79 10/21/2014   AST 44* 10/21/2014   ALT 19 10/21/2014  .   Total Time in preparing paper work, data evaluation and todays exam - 35 minutes  Thurnell Lose M.D on 10/27/2014 at 10:28 AM  [facility] Office  [phone]

## 2014-10-26 LAB — BASIC METABOLIC PANEL
ANION GAP: 10 (ref 5–15)
BUN: 6 mg/dL (ref 6–23)
CO2: 22 mmol/L (ref 19–32)
Calcium: 9 mg/dL (ref 8.4–10.5)
Chloride: 99 mEq/L (ref 96–112)
Creatinine, Ser: 0.53 mg/dL (ref 0.50–1.35)
GFR calc Af Amer: >90 mL/min (ref >90)
GLUCOSE: 100 mg/dL — AB (ref 70–99)
POTASSIUM: 4 mmol/L (ref 3.5–5.1)
Sodium: 131 mmol/L — ABNORMAL LOW (ref 135–145)

## 2014-10-26 NOTE — Progress Notes (Signed)
Progress Note  Art Levan NTI:[id number] DOB: 17-Feb-1956 DOA: 10/20/2014 PCP: No primary care provider on file.   Brief narrative:  59 year old WM PMHx schizophrenia, hypertension and dyslipidemia. Brought to the ER after becoming agitated at the nursing facility. The patient's sister provides some history and reported the patient was recently transferred to the skilled nursing facility from his group home. Patient apparently fallen in October 2015 was hospitalized at [facility], C-spine at that time showed some type of abnormality but nonsurgical/conservative management was opted for. Since that time the patient apparently been wheelchair-bound. Patient also had a history of recent pneumonia in November 2015. Evaluated by the admitting physician in the ER patient was able to repeat his name and confirm he has a sister but he was otherwise very restless. Denied any pain. Chest x-ray revealed only emphysema. Labs revealed leukocytosis and severe hyponatremia. Urinalysis was unremarkable. Because of the severity of hyponatremia could care medicine was also consulted. Patient was in the ER for several hours and repeat laboratory data revealed slight improvement in sodium so no indication to admit patient in ICU.  Since admission patient's serologies seem consistent with SIADH: Low urine and serum osmolality, elevated joining sodium although not greater than 40. He was also found to have elevated CPK greater than 700 noting patient is on statin drug prior to admission. Unclear degree and severity of agitation at nursing home although as previously reported patient wheelchair bound so unclear if elevated CPK a result of musculoskeletal injury in setting of agitation.  Na has increased with fluid restriction and has increased after the addition of lower dose Risperdal; Psych MD recommeded starting SNRI (instead of SSRI) once Na corrected and follow Na closely. CT of C spine and Head after  admit show no acute changes. PT/OT eval pending    HPI/Subjective:  In bed, denies headache, chest pain denies any shortness of breath. No focal weakness. Minimal to no confusion.    Assessment/Plan:     Acute Hyponatremia/presumed SIADH  -Initially likely was SIADH from psych meds, medications adjusted by psychiatry, with medication adjustments and fluid restriction sodium has improved. He has been discharged. We await bed for placement.     Fall /Elevated CPK -Due to combination of fall and statin, trending down. Statin discontinued.    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    Essential hypertension -stable on Lopressor    DVT prophylaxis: Lovenox Code Status: Full Family Communication: No family at bedside Disposition Plan/Expected LOS: SNF   Consultants: Dr.JONNALAGADDA,JANARDHAHA (Psychiatry)   Procedures: 1/14 CT head/C-spine;No evidence of acute intracranial abnormality. No acute osseous abnormality identified in the cervical spine  Cultures: None  Antibiotics: Was given one-time dose Rocephin and ER  Objective: Blood pressure 130/78, pulse 85, temperature 97.9 F (36.6 C), temperature source Oral, resp. rate 16, height  6'  2" (1.88 m), weight 70.308 kg (155 lb), SpO2 96 %.  Intake/Output Summary (Last 24 hours) at 10/26/14 0957 Last data filed at 10/26/14 0954  Gross per 24 hour  Intake    860 ml  Output   2750 ml  Net  -1890 ml     Exam: Gen: A/O 3 (does not know why), No acute respiratory distress; flat affect, not agitated Chest: Clear to auscultation bilaterally without wheezes, rhonchi or crackles, room air Cardiac: Regular rate and rhythm, S1-S2, no rubs murmurs or gallops, no peripheral edema, no JVD Abdomen: Soft nontender nondistended without obvious hepatosplenomegaly, no ascites Extremities: Symmetrical in appearance without cyanosis, clubbing or effusion  Scheduled Meds:  Scheduled Meds: . benztropine  0.5 mg Oral BID  . calcitonin (salmon)  1 spray Alternating Nares Daily  . enoxaparin (LOVENOX) injection  40 mg Subcutaneous Daily  . LORazepam  1 mg Oral BID  . metoprolol tartrate  25 mg Oral BID  . risperiDONE  1 mg Oral BID  . sodium chloride  3 mL Intravenous Q12H   Continuous Infusions:    Data Reviewed: Basic Metabolic Panel:  Recent Labs Lab 10/23/14 0350 10/24/14 0340 10/24/14 1614 10/25/14 0413 10/26/14 0424  NA 129* 128* 129* 129* 131*  K 3.9 3.5 3.4* 3.4* 4.0  CL 93* 94* 94* 96 99  CO2 26 28 25 26 22   GLUCOSE 95 99 127* 100* 100*  BUN 9 7 6  <5* 6  CREATININE 0.47* 0.46* 0.44* 0.47* 0.53  CALCIUM 8.8 8.8 8.5 8.8 9.0   Liver Function Tests:  Recent Labs Lab 10/20/14 1759 10/21/14 0400  AST 42* 44*  ALT 20 19  ALKPHOS 85 79  BILITOT 0.8 0.7  PROT 6.5 5.9*  ALBUMIN 3.5 3.1*   No results for input(s): LIPASE, AMYLASE in the last 168 hours.  Recent Labs Lab 10/21/14 0703  AMMONIA 31   CBC:  Recent Labs Lab 10/20/14 1759 10/21/14 0400  WBC 16.2* 11.2*  NEUTROABS 13.2* 9.1*  HGB 13.1 12.8*  HCT 35.5* 34.8*  MCV 84.1 86.6  PLT 368 325   Cardiac Enzymes:  Recent Labs Lab 10/21/14 0400 10/22/14 0312  CKTOTAL 768* 246*   BNP (last 3  results) No results for input(s): PROBNP in the last 8760 hours. CBG: No results for input(s): GLUCAP in the last 168 hours.  Recent Results (from the past 240 hour(s))  MRSA PCR Screening     Status: None   Collection Time: 10/20/14 11:25 PM  Result Value Ref Range Status   MRSA by PCR NEGATIVE NEGATIVE Final    Comment:        The GeneXpert MRSA Assay (FDA approved for NASAL specimens only), is one component of a comprehensive MRSA colonization surveillance program. It is not intended to diagnose MRSA infection nor to guide or monitor treatment for MRSA infections.      Thurnell Lose M.D on 10/26/2014 at 9:57 AM  Between 7am to 7pm - Pager - [phone], After 7pm go to [email or web address] - password [email or web address]  And look for the night coverage person covering me after hours  [facility]  Office  [phone]

## 2014-10-26 NOTE — Care Management Note (Signed)
An Important Message From Medicare About Patient's Rights given . Chisom Aust RN BSN  

## 2014-10-27 NOTE — Clinical Social Work Note (Signed)
Patient to be discharged to [facility]. Patient's sister updated regarding discharge.  *Updated Level II PASARR received on 10/27/2014*  Facility: [facility] Report number: [phone] Transportation: EMS ([address]  Lubertha Sayres, [place] ([phone]) Licensed Clinical Social Worker Orthopedics [phone]) and Surgical [phone])

## 2014-10-27 NOTE — Progress Notes (Signed)
Pt d/c'd back to SNF, [facility], transporters/[facility] transported pt via Biomedical scientist with his belongings to facility. Discharge information and instructions sent with pt to facility by [facility].

## 2015-05-12 ENCOUNTER — Inpatient Hospital Stay (HOSPITAL_COMMUNITY)
Admission: EM | Admit: 2015-05-12 | Discharge: 2015-05-14 | DRG: 885 | Disposition: A | Discharge status: Skilled Nursing Facility | Payer: Commercial Managed Care - HMO | Attending: Internal Medicine | Admitting: Internal Medicine

## 2015-05-12 ENCOUNTER — Encounter (HOSPITAL_COMMUNITY): Payer: Self-pay

## 2015-05-12 ENCOUNTER — Emergency Department (HOSPITAL_COMMUNITY): Payer: Commercial Managed Care - HMO

## 2015-05-12 DIAGNOSIS — D509 Iron deficiency anemia, unspecified: Secondary | ICD-10-CM | POA: Diagnosis not present

## 2015-05-12 DIAGNOSIS — F200 Paranoid schizophrenia: Secondary | ICD-10-CM | POA: Diagnosis present

## 2015-05-12 DIAGNOSIS — I10 Essential (primary) hypertension: Secondary | ICD-10-CM | POA: Diagnosis present

## 2015-05-12 DIAGNOSIS — E291 Testicular hypofunction: Secondary | ICD-10-CM | POA: Diagnosis present

## 2015-05-12 DIAGNOSIS — E871 Hypo-osmolality and hyponatremia: Secondary | ICD-10-CM | POA: Diagnosis present

## 2015-05-12 DIAGNOSIS — E785 Hyperlipidemia, unspecified: Secondary | ICD-10-CM | POA: Diagnosis present

## 2015-05-12 DIAGNOSIS — Z79899 Other long term (current) drug therapy: Secondary | ICD-10-CM | POA: Diagnosis not present

## 2015-05-12 DIAGNOSIS — Z87891 Personal history of nicotine dependence: Secondary | ICD-10-CM

## 2015-05-12 DIAGNOSIS — Z993 Dependence on wheelchair: Secondary | ICD-10-CM | POA: Diagnosis not present

## 2015-05-12 DIAGNOSIS — R451 Restlessness and agitation: Secondary | ICD-10-CM | POA: Diagnosis present

## 2015-05-12 DIAGNOSIS — M810 Age-related osteoporosis without current pathological fracture: Secondary | ICD-10-CM | POA: Diagnosis present

## 2015-05-12 DIAGNOSIS — E559 Vitamin D deficiency, unspecified: Secondary | ICD-10-CM | POA: Diagnosis present

## 2015-05-12 DIAGNOSIS — K5900 Constipation, unspecified: Secondary | ICD-10-CM | POA: Diagnosis present

## 2015-05-12 DIAGNOSIS — E860 Dehydration: Secondary | ICD-10-CM | POA: Diagnosis present

## 2015-05-12 DIAGNOSIS — D649 Anemia, unspecified: Secondary | ICD-10-CM | POA: Diagnosis present

## 2015-05-12 LAB — CBC WITH DIFFERENTIAL/PLATELET
Basophils Absolute: 0 10*3/uL (ref 0.0–0.1)
Basophils Relative: 0 % (ref 0–1)
EOS ABS: 0.1 10*3/uL (ref 0.0–0.7)
EOS PCT: 1 % (ref 0–5)
HCT: 24.6 % — ABNORMAL LOW (ref 39.0–52.0)
Hemoglobin: 8 g/dL — ABNORMAL LOW (ref 13.0–17.0)
LYMPHS ABS: 1.9 10*3/uL (ref 0.7–4.0)
Lymphocytes Relative: 23 % (ref 12–46)
MCH: 29.4 pg (ref 26.0–34.0)
MCHC: 32.5 g/dL (ref 30.0–36.0)
MCV: 90.4 fL (ref 78.0–100.0)
Monocytes Absolute: 1.8 10*3/uL — ABNORMAL HIGH (ref 0.1–1.0)
Monocytes Relative: 21 % — ABNORMAL HIGH (ref 3–12)
Neutro Abs: 4.5 10*3/uL (ref 1.7–7.7)
Neutrophils Relative %: 55 % (ref 43–77)
PLATELETS: 482 10*3/uL — AB (ref 150–400)
RBC: 2.72 MIL/uL — AB (ref 4.22–5.81)
RDW: 15.6 % — AB (ref 11.5–15.5)
WBC: 8.4 10*3/uL (ref 4.0–10.5)

## 2015-05-12 LAB — COMPREHENSIVE METABOLIC PANEL
ALK PHOS: 70 U/L (ref 38–126)
ALT: 15 U/L — AB (ref 17–63)
AST: 18 U/L (ref 15–41)
Albumin: 2.9 g/dL — ABNORMAL LOW (ref 3.5–5.0)
Anion gap: 6 (ref 5–15)
BUN: 12 mg/dL (ref 6–20)
CALCIUM: 7.9 mg/dL — AB (ref 8.9–10.3)
CO2: 27 mmol/L (ref 22–32)
Chloride: 95 mmol/L — ABNORMAL LOW (ref 101–111)
Creatinine, Ser: 0.65 mg/dL (ref 0.61–1.24)
GFR calc Af Amer: >60 mL/min (ref >60)
GFR calc non Af Amer: >60 mL/min (ref >60)
Glucose, Bld: 105 mg/dL — ABNORMAL HIGH (ref 65–99)
POTASSIUM: 4.5 mmol/L (ref 3.5–5.1)
Sodium: 128 mmol/L — ABNORMAL LOW (ref 135–145)
Total Bilirubin: 0.4 mg/dL (ref 0.3–1.2)
Total Protein: 5.5 g/dL — ABNORMAL LOW (ref 6.5–8.1)

## 2015-05-12 LAB — URINALYSIS, ROUTINE W REFLEX MICROSCOPIC
BILIRUBIN URINE: NEGATIVE
Glucose, UA: NEGATIVE mg/dL
Hgb urine dipstick: NEGATIVE
Ketones, ur: NEGATIVE mg/dL
Leukocytes, UA: NEGATIVE
Nitrite: NEGATIVE
PH: 7 (ref 5.0–8.0)
Protein, ur: NEGATIVE mg/dL
Specific Gravity, Urine: 1.016 (ref 1.005–1.030)
Urobilinogen, UA: 0.2 mg/dL (ref 0.0–1.0)

## 2015-05-12 LAB — IRON AND TIBC
IRON: 22 ug/dL — AB (ref 45–182)
Saturation Ratios: 10 % — ABNORMAL LOW (ref 17.9–39.5)
TIBC: 218 ug/dL — ABNORMAL LOW (ref 250–450)
UIBC: 196 ug/dL

## 2015-05-12 LAB — PREPARE RBC (CROSSMATCH)

## 2015-05-12 LAB — POC OCCULT BLOOD, ED: FECAL OCCULT BLD: NEGATIVE

## 2015-05-12 LAB — VITAMIN B12: Vitamin B-12: 173 pg/mL — ABNORMAL LOW (ref 180–914)

## 2015-05-12 LAB — ABO/RH: ABO/RH(D): O POS

## 2015-05-12 LAB — RETICULOCYTES
RBC.: 2.37 MIL/uL — AB (ref 4.22–5.81)
RETIC CT PCT: 8.9 % — AB (ref 0.4–3.1)
Retic Count, Absolute: 210.9 10*3/uL — ABNORMAL HIGH (ref 19.0–186.0)

## 2015-05-12 LAB — PROTIME-INR
INR: 1.01 (ref 0.00–1.49)
Prothrombin Time: 13.5 seconds (ref 11.6–15.2)

## 2015-05-12 LAB — TSH: TSH: 0.829 u[IU]/mL (ref 0.350–4.500)

## 2015-05-12 MED ORDER — SODIUM CHLORIDE 0.9 % IV BOLUS (SEPSIS)
1000.0000 mL | Freq: Once | INTRAVENOUS | Status: AC
  Administered 2015-05-12: 1000 mL via INTRAVENOUS

## 2015-05-12 MED ORDER — SODIUM CHLORIDE 0.9 % IV SOLN
INTRAVENOUS | Status: AC
  Administered 2015-05-12: 14:00:00 via INTRAVENOUS

## 2015-05-12 MED ORDER — SODIUM CHLORIDE 0.9 % IV SOLN
Freq: Once | INTRAVENOUS | Status: AC
  Administered 2015-05-12: 15:00:00 via INTRAVENOUS

## 2015-05-12 MED ORDER — SENNOSIDES-DOCUSATE SODIUM 8.6-50 MG PO TABS
1.0000 | ORAL_TABLET | Freq: Two times a day (BID) | ORAL | Status: DC
  Administered 2015-05-12 – 2015-05-14 (×5): 1 via ORAL
  Filled 2015-05-12 (×5): qty 1

## 2015-05-12 NOTE — ED Notes (Addendum)
Pt. Unable to urinate at this time. Will collect specimen when pt. Voids. Nurse was aware.

## 2015-05-12 NOTE — Care Management Note (Signed)
Case Management Note  Patient Details  Name: Jeremiah [name] MRN: [id number] Date of Birth: 1956/05/16  Subjective/Objective:  59 y/o m admitted w/anemia, hyponatremia. From home.                  Action/Plan:d/c plan home.   Expected Discharge Date:                  Expected Discharge Plan:  Home/Self Care  In-House Referral:     Discharge planning Services  CM Consult  Post Acute Care Choice:    Choice offered to:     DME Arranged:    DME Agency:     HH Arranged:    HH Agency:     Status of Service:  In process, will continue to follow  Medicare Important Message Given:    Date Medicare IM Given:    Medicare IM give by:    Date Additional Medicare IM Given:    Additional Medicare Important Message give by:     If discussed at [place] of Stay Meetings, dates discussed:    Additional Comments:  Dessa Phi, RN 05/12/2015, 1:23 PM

## 2015-05-12 NOTE — ED Notes (Signed)
Pt from [facility].  Hx paranoid schizophrenia and had a recent, abrupt med change.  Was taken off Risperdal 1mg  TID (that he'd been on for years) and placed on Lamictal 25 mg TID on 05/02/15.  This am the staff found him to be pacing, pale, had a decrease in BP.  Pt not a reliable historian or able to explain what is happening per his normal.  Per ems he's cool, pale, clammy but didn't report any pain.

## 2015-05-12 NOTE — H&P (Signed)
History and Physical  Jeremiah [name] VXY:[id number] DOB: Feb 13, 1956 DOA: 05/12/2015  Referring physician: EDP PCP: No primary care provider on file.   Chief Complaint: agitation  HPI: Jeremiah [name] is a 59 y.o. male  with history of paranoid schizophrenia, hypertension, hyperlipidemia and hyponatremia was brought to the ER after patient became agitated at his nursing home, per nursing home report there was a recent change of his antipsychotic meds for unclear reason. Risperdal/cogentin which patient has been on for long time was recently stopped and he was started o lamictal and ativan a few weeks ago.  Patient was sent to the ED for further eval.  ED course: cbc showed hgb 8 (hgb was 12.8 in 10/2014), per EDP, rectal exam reviewed brown heme negative stool. UA ia pending,  Bmp showed hyponatremia, na of 128. EKG/cxr unremarkable. He received ivf, hospitalist called for admission.  Patient looks pale, but denies chest pain/sob/weakness/diziness. No cough, no fever, he does report being constipated, patient is oriented but not a reliable historian, does has psychomotor agitation during exam.  Review of Systems:  Detail per HPI, Review of systems are otherwise negative  Past Medical History  Diagnosis Date  . Paranoid schizophrenia   . Osteoporosis   . Respiratory failure hypoxic  . Vitamin D deficiency   . Hypertension   . Hyperlipidemia   . Hypogonadism in male   . Pneumonia healthcare-associated  . Hyponatremia   . Fracture spine   Past Surgical History  Procedure Laterality Date  . No past surgeries     Social History:  reports that he has quit smoking. He has never used smokeless tobacco. He reports that he does not drink alcohol or use illicit drugs. Patient lives at [facility] & is able to participate in activities of daily living independently , reported was wheelchair bound for a while, has been able to walk without assistance since early this year.  No Known Allergies  Family  History  Problem Relation Age of Onset  . Family history unknown: Yes      Prior to Admission medications   Medication Sig Start Date End Date Taking? Authorizing Provider  lamoTRIgine (LAMICTAL) 25 MG tablet Take 25 mg by mouth 3 (three) times daily. 05/10/15  Yes Historical Provider, MD  LORazepam (ATIVAN) 0.5 MG tablet Take 2 tablets (1 mg total) by mouth every 8 (eight) hours as needed for anxiety. 10/25/14  Yes Thurnell Lose, MD  benztropine (COGENTIN) 0.5 MG tablet Take 1 tablet (0.5 mg total) by mouth 2 (two) times daily. Patient not taking: Reported on 05/12/2015 10/25/14   Thurnell Lose, MD  niacin 100 MG tablet Take 1 tablet (100 mg total) by mouth at bedtime. Patient not taking: Reported on 05/12/2015 10/25/14   Thurnell Lose, MD  risperiDONE (RISPERDAL) 1 MG tablet Take 1 tablet (1 mg total) by mouth 2 (two) times daily. Patient not taking: Reported on 05/12/2015 10/25/14   Thurnell Lose, MD    Physical Exam: BP 176/100 mmHg  Pulse 110  Temp(Src) 98.9 F (37.2 C) (Oral)  Resp 20  SpO2 96%  General:  Pale, fidgiting  Eyes: PERRL ENT: unremarkable Neck: supple, no JVD Cardiovascular: sinus tachycardia Respiratory: CTABL Abdomen: soft/ND/ND, positive bowel sounds Skin: no rash Musculoskeletal:  No edema Psychiatric:  does has psychomotor agitation, but follow command. Neurologic: no focal findings            Labs on Admission:  Basic Metabolic Panel:  Recent Labs Lab 05/12/15 1055  NA  128*  K 4.5  CL 95*  CO2 27  GLUCOSE 105*  BUN 12  CREATININE 0.65  CALCIUM 7.9*   Liver Function Tests:  Recent Labs Lab 05/12/15 1055  AST 18  ALT 15*  ALKPHOS 70  BILITOT 0.4  PROT 5.5*  ALBUMIN 2.9*   No results for input(s): LIPASE, AMYLASE in the last 168 hours. No results for input(s): AMMONIA in the last 168 hours. CBC:  Recent Labs Lab 05/12/15 1055  WBC 8.4  NEUTROABS 4.5  HGB 8.0*  HCT 24.6*  MCV 90.4  PLT 482*   Cardiac Enzymes: No  results for input(s): CKTOTAL, CKMB, CKMBINDEX, TROPONINI in the last 168 hours.  BNP (last 3 results) No results for input(s): BNP in the last 8760 hours.  ProBNP (last 3 results) No results for input(s): PROBNP in the last 8760 hours.  CBG: No results for input(s): GLUCAP in the last 168 hours.  Radiological Exams on Admission: Dg Chest 2 View  05/12/2015   CLINICAL DATA:  Agitation, pale  EXAM: CHEST  2 VIEW  COMPARISON:  10/20/2014  FINDINGS: Cardiomediastinal silhouette is stable. There is chronic mild elevation of the left hemidiaphragm. No acute infiltrate or pleural effusion. No pulmonary edema. Osteopenia and degenerative changes thoracic spine. Stable mild compression deformity mid thoracic spine. There is significant compression deformity upper lumbar spine of indeterminate age. Clinical correlation is necessary.  IMPRESSION: There is chronic mild elevation of the left hemidiaphragm. No acute infiltrate or pleural effusion. No pulmonary edema. Osteopenia and degenerative changes thoracic spine. Stable mild compression deformity mid thoracic spine. There is significant compression deformity upper lumbar spine of indeterminate age. Clinical correlation is necessary.   Electronically Signed   By: Lahoma Crocker M.D.   On: 05/12/2015 11:31    EKG: Independently reviewed. Sinus tachycardia, no acute ST/T changes, QTc wnl.  Assessment/Plan Present on Admission:  . Anemia  Anemia, normocytic, unclear etiology. Anemia work up ( Fish farm manager) pending. tbili wnl , less likely hemolysis.  expect hgb further drop with ivf , patient has tachycardia, look pale, will Transfuse one unit prbc. Drug side effect from lamictal? Is also a consideration.  Hyponatremia: likely chronic, likely combination from psyh meds and dehydration. ivf for now.  Paranoid schizophrenia: reported recent meds changes, psych consulted for meds assistance.  H/o HTN, not on meds at home, bp stable.  Monitor.  DVT prophylaxis: scd  Consultants: psychiatry for meds assistance  Code Status: full   Family Communication:  Patient   Disposition Plan: admit to med tele  Time spent: 80mins  Akim Watkinson MD, PhD [facility] Pager [phone] If 7PM-7AM, please contact night-coverage at [email or web address], password [facility]

## 2015-05-12 NOTE — Care Management Note (Signed)
Case Management Note  Patient Details  Name: Vandell Kun MRN: [id number] Date of Birth: 10/03/1956  Subjective/Objective: From SNF-[facility].  CSW already following.                 Action/Plan:d/c plan return to SNF.   Expected Discharge Date:   (unknown)               Expected Discharge Plan:  Skilled Nursing Facility  In-House Referral:  Clinical Social Work  Discharge planning Services  CM Consult  Post Acute Care Choice:    Choice offered to:     DME Arranged:    DME Agency:     HH Arranged:    [place] Agency:     Status of Service:  In process, will continue to follow  Medicare Important Message Given:    Date Medicare IM Given:    Medicare IM give by:    Date Additional Medicare IM Given:    Additional Medicare Important Message give by:     If discussed at [place] of Stay Meetings, dates discussed:    Additional Comments:  Dessa Phi, RN 05/12/2015, 2:05 PM

## 2015-05-12 NOTE — Clinical Social Work Note (Signed)
Clinical Social Work Assessment  Patient Details  Name: Jeremiah [name] MRN: [id number] Date of Birth: 1956/09/22  Date of referral:  05/12/15               Reason for consult:  Facility Placement                Permission sought to share information with:  Facility Art therapist granted to share information::  Yes, Verbal Permission Granted  Name::        Agency::     Relationship::     Contact Information:     Housing/Transportation Living arrangements for the past 2 months:  [place] of Information:   (sister, Michiel Cowboy) Patient Interpreter Needed:  None Criminal Activity/Legal Involvement Pertinent to Current Situation/Hospitalization:  No - Comment as needed Significant Relationships:  Siblings Lives with:  Facility Resident Do you feel safe going back to the place where you live?  Yes Need for family participation in patient care:  No (Coment)  Care giving concerns:  CSW received consult that patient was admitted from [facility].   Social Worker assessment / plan:  CSW confirmed with patient's sister, Michiel Cowboy that patient will be returning to [facility] once discharged from the hospital.   Employment status:    Insurance information:  Managed Medicare PT Recommendations:  Not assessed at this time Information / Referral to community resources:  [place]  Patient/Family's Response to care:  Patient's sister informed CSW that they have been very pleased with [facility].   Patient/Family's Understanding of and Emotional Response to Diagnosis, Current Treatment, and Prognosis:  Sister was concerned about patient behaviors, though when CSW went to assess patient he was resting quietly, no issues.   Emotional Assessment Appearance:  Appears stated age Attitude/Demeanor/Rapport:    Affect (typically observed):  Calm, Quiet Orientation:  Oriented to Self, Oriented to Place, Oriented to  Time Alcohol / Substance use:     Psych involvement (Current and /or in the community):     Discharge Needs  Concerns to be addressed:    Readmission within the last 30 days:    Current discharge risk:    Barriers to Discharge:      Standley Brooking, LCSW 05/12/2015, 2:46 PM

## 2015-05-12 NOTE — ED Provider Notes (Signed)
CSN: [id number]     Arrival date & time 05/12/15  1004 History   First MD Initiated Contact with Patient 05/12/15 1027     Chief Complaint  Patient presents with  . Tachycardia     (Consider location/radiation/quality/duration/timing/severity/associated sxs/prior Treatment) Patient is a 59 y.o. male presenting with altered mental status. The history is provided by the patient.  Altered Mental Status Presenting symptoms: behavior changes (increased agitation)   Presenting symptoms: no confusion and no partial responsiveness   Severity:  Mild Most recent episode:  Today Episode history:  Continuous Duration:  2 days Timing:  Constant Progression:  Unchanged Chronicity:  New Context: not head injury, not homeless and not a recent change in medication   Context comment:  Psychiatric resident Associated symptoms: agitation   Associated symptoms: no abdominal pain, normal movement, no fever, no headaches, no palpitations, no rash, no slurred speech and no vomiting    59 yo M with a chief complaint of increased agitation. Patient was recently changed from Risperdal to Lamictal and since then has had some change in his behavior. Over the past couple days the patient has been increasingly agitated and pacing back and forth in the hallways. The patient is a poor historian and unable to provide any significant history. Patient states that he currently has no symptoms that he is fine and will be okay.  Past Medical History  Diagnosis Date  . Paranoid schizophrenia   . Osteoporosis   . Respiratory failure hypoxic  . Vitamin D deficiency   . Hypertension   . Hyperlipidemia   . Hypogonadism in male   . Pneumonia healthcare-associated  . Hyponatremia   . Fracture spine   Past Surgical History  Procedure Laterality Date  . No past surgeries     Family History  Problem Relation Age of Onset  . Family history unknown: Yes   History  Substance Use Topics  . Smoking status: Former Research scientist (life sciences)   . Smokeless tobacco: Never Used  . Alcohol Use: No    Review of Systems  Constitutional: Negative for fever and chills.  HENT: Negative for congestion and facial swelling.   Eyes: Negative for discharge and visual disturbance.  Respiratory: Negative for shortness of breath.   Cardiovascular: Negative for chest pain and palpitations.  Gastrointestinal: Negative for vomiting, abdominal pain and diarrhea.  Musculoskeletal: Negative for myalgias and arthralgias.  Skin: Negative for color change and rash.  Neurological: Negative for tremors, syncope and headaches.  Psychiatric/Behavioral: Positive for agitation. Negative for confusion and dysphoric mood.      Allergies  Review of patient's allergies indicates no known allergies.  Home Medications   Prior to Admission medications   Medication Sig Start Date End Date Taking? Authorizing Provider  lamoTRIgine (LAMICTAL) 25 MG tablet Take 25 mg by mouth 3 (three) times daily. 05/10/15  Yes Historical Provider, MD  LORazepam (ATIVAN) 0.5 MG tablet Take 2 tablets (1 mg total) by mouth every 8 (eight) hours as needed for anxiety. 10/25/14  Yes Thurnell Lose, MD  benztropine (COGENTIN) 0.5 MG tablet Take 1 tablet (0.5 mg total) by mouth 2 (two) times daily. Patient not taking: Reported on 05/12/2015 10/25/14   Thurnell Lose, MD  niacin 100 MG tablet Take 1 tablet (100 mg total) by mouth at bedtime. Patient not taking: Reported on 05/12/2015 10/25/14   Thurnell Lose, MD  risperiDONE (RISPERDAL) 1 MG tablet Take 1 tablet (1 mg total) by mouth 2 (two) times daily. Patient not taking: Reported  on 05/12/2015 10/25/14   Thurnell Lose, MD   BP 127/56 mmHg  Pulse 106  Temp(Src) 98.6 F (37 C) (Oral)  Resp 16  Ht 6\' 2"  (1.88 m)  Wt 181 lb 3.5 oz (82.2 kg)  BMI 23.26 kg/m2  SpO2 100% Physical Exam  Constitutional: He is oriented to person, place, and time. He appears well-developed and well-nourished.  Marked pallor  HENT:  Head:  Normocephalic and atraumatic.  Eyes: EOM are normal. Pupils are equal, round, and reactive to light.  Neck: Normal range of motion. Neck supple. No JVD present.  Cardiovascular: Normal rate and regular rhythm.  Exam reveals no gallop and no friction rub.   No murmur heard. Pulmonary/Chest: No respiratory distress. He has no wheezes.  Abdominal: He exhibits no distension. There is no tenderness. There is no rebound and no guarding.  Musculoskeletal: Normal range of motion.  Neurological: He is alert and oriented to person, place, and time.  Fine tremor  Skin: No rash noted. No pallor.  Psychiatric: He has a normal mood and affect. His behavior is normal.    ED Course  Procedures (including critical care time) Labs Review Labs Reviewed  CBC WITH DIFFERENTIAL/PLATELET - Abnormal; Notable for the following:    RBC 2.72 (*)    Hemoglobin 8.0 (*)    HCT 24.6 (*)    RDW 15.6 (*)    Platelets 482 (*)    Monocytes Relative 21 (*)    Monocytes Absolute 1.8 (*)    All other components within normal limits  COMPREHENSIVE METABOLIC PANEL - Abnormal; Notable for the following:    Sodium 128 (*)    Chloride 95 (*)    Glucose, Bld 105 (*)    Calcium 7.9 (*)    Total Protein 5.5 (*)    Albumin 2.9 (*)    ALT 15 (*)    All other components within normal limits  URINALYSIS, ROUTINE W REFLEX MICROSCOPIC (NOT AT [facility]) - Abnormal; Notable for the following:    APPearance CLOUDY (*)    All other components within normal limits  RETICULOCYTES - Abnormal; Notable for the following:    Retic Ct Pct 8.9 (*)    RBC. 2.37 (*)    Retic Count, Manual 210.9 (*)    All other components within normal limits  VITAMIN B12 - Abnormal; Notable for the following:    Vitamin B-12 173 (*)    All other components within normal limits  IRON AND TIBC - Abnormal; Notable for the following:    Iron 22 (*)    TIBC 218 (*)    Saturation Ratios 10 (*)    All other components within normal limits  TSH   PROTIME-INR  FOLATE RBC  OCCULT BLOOD X 1 CARD TO LAB, STOOL  POC OCCULT BLOOD, ED  TYPE AND SCREEN  ABO/RH  PREPARE RBC (CROSSMATCH)    Imaging Review Dg Chest 2 View  05/12/2015   CLINICAL DATA:  Agitation, pale  EXAM: CHEST  2 VIEW  COMPARISON:  10/20/2014  FINDINGS: Cardiomediastinal silhouette is stable. There is chronic mild elevation of the left hemidiaphragm. No acute infiltrate or pleural effusion. No pulmonary edema. Osteopenia and degenerative changes thoracic spine. Stable mild compression deformity mid thoracic spine. There is significant compression deformity upper lumbar spine of indeterminate age. Clinical correlation is necessary.  IMPRESSION: There is chronic mild elevation of the left hemidiaphragm. No acute infiltrate or pleural effusion. No pulmonary edema. Osteopenia and degenerative changes thoracic spine. Stable mild  compression deformity mid thoracic spine. There is significant compression deformity upper lumbar spine of indeterminate age. Clinical correlation is necessary.   Electronically Signed   By: Lahoma Crocker M.D.   On: 05/12/2015 11:31     EKG Interpretation   Date/Time:  Thursday May 12 2015 10:20:19 EDT Ventricular Rate:  89 PR Interval:  145 QRS Duration: 84 QT Interval:  369 QTC Calculation: 449 R Axis:   11 Text Interpretation:  Sinus rhythm Abnormal R-wave progression, early  transition tachycardia resolved Otherwise no significant change Confirmed  by Laquandra Carrillo MD, DANIEL ([id number]) on 05/12/2015 11:55:46 AM      MDM   Diagnosis 1. Anemia  59 yo M with a chief complaint of increased agitation. Patient poor historian difficult to determine course or symptomatology. Will obtain EKG chest x-ray CBC CMP urine.  CBC with 5 g hemoglobin drop over the past 3 months. Rectal exam negative for gross blood or melena. Labwork reviewed with stable hyponatremia. Will admit to the hospital.  The patients results and plan were reviewed and discussed.   Any  x-rays performed were independently reviewed by myself.   Differential diagnosis were considered with the presenting HPI.  Medications  0.9 %  sodium chloride infusion ( Intravenous New Bag/Given 05/12/15 1409)  0.9 %  sodium chloride infusion (not administered)  senna-docusate (Senokot-S) tablet 1 tablet (1 tablet Oral Given 05/12/15 1408)  sodium chloride 0.9 % bolus 1,000 mL (0 mLs Intravenous Stopped 05/12/15 1202)  sodium chloride 0.9 % bolus 1,000 mL (0 mLs Intravenous Stopped 05/12/15 1250)    Filed Vitals:   05/12/15 1229 05/12/15 1250 05/12/15 1315 05/12/15 1515  BP: 176/100 121/63 134/69 127/56  Pulse: 110 99 103 106  Temp:   98.4 F (36.9 C) 98.6 F (37 C)  TempSrc:   Oral Oral  Resp: 20 18 18 16   Height:   6\' 2"  (1.88 m)   Weight:   181 lb 3.5 oz (82.2 kg)   SpO2: 96% 99% 99% 100%    Final diagnoses:  None    Admission/ observation were discussed with the admitting physician, patient and/or family and they are comfortable with the plan.    Deno Etienne, DO 05/12/15 1530

## 2015-05-13 DIAGNOSIS — D509 Iron deficiency anemia, unspecified: Secondary | ICD-10-CM

## 2015-05-13 LAB — COMPREHENSIVE METABOLIC PANEL
ALBUMIN: 2.7 g/dL — AB (ref 3.5–5.0)
ALT: 15 U/L — ABNORMAL LOW (ref 17–63)
ANION GAP: 6 (ref 5–15)
AST: 15 U/L (ref 15–41)
Alkaline Phosphatase: 65 U/L (ref 38–126)
BUN: 12 mg/dL (ref 6–20)
CO2: 25 mmol/L (ref 22–32)
CREATININE: 0.57 mg/dL — AB (ref 0.61–1.24)
Calcium: 7.7 mg/dL — ABNORMAL LOW (ref 8.9–10.3)
Chloride: 98 mmol/L — ABNORMAL LOW (ref 101–111)
GLUCOSE: 110 mg/dL — AB (ref 65–99)
POTASSIUM: 3.9 mmol/L (ref 3.5–5.1)
SODIUM: 129 mmol/L — AB (ref 135–145)
Total Bilirubin: 0.2 mg/dL — ABNORMAL LOW (ref 0.3–1.2)
Total Protein: 5.5 g/dL — ABNORMAL LOW (ref 6.5–8.1)

## 2015-05-13 LAB — TYPE AND SCREEN
ABO/RH(D): O POS
ANTIBODY SCREEN: NEGATIVE
Unit division: 0

## 2015-05-13 LAB — CBC
HCT: 26.3 % — ABNORMAL LOW (ref 39.0–52.0)
HEMOGLOBIN: 9 g/dL — AB (ref 13.0–17.0)
MCH: 30.8 pg (ref 26.0–34.0)
MCHC: 34.2 g/dL (ref 30.0–36.0)
MCV: 90.1 fL (ref 78.0–100.0)
PLATELETS: 523 10*3/uL — AB (ref 150–400)
RBC: 2.92 MIL/uL — ABNORMAL LOW (ref 4.22–5.81)
RDW: 15.9 % — AB (ref 11.5–15.5)
WBC: 10.3 10*3/uL (ref 4.0–10.5)

## 2015-05-13 LAB — FOLATE RBC
FOLATE, RBC: 1613 ng/mL (ref >498)
Folate, Hemolysate: 346.7 ng/mL
Hematocrit: 21.5 % — ABNORMAL LOW (ref 37.5–51.0)

## 2015-05-13 LAB — PROTIME-INR
INR: 0.99 (ref 0.00–1.49)
PROTHROMBIN TIME: 13.3 s (ref 11.6–15.2)

## 2015-05-13 MED ORDER — RISPERIDONE 0.5 MG PO TABS
0.5000 mg | ORAL_TABLET | Freq: Two times a day (BID) | ORAL | Status: DC
  Administered 2015-05-13 – 2015-05-14 (×3): 0.5 mg via ORAL
  Filled 2015-05-13 (×3): qty 1

## 2015-05-13 MED ORDER — VITAMIN B-12 100 MCG PO TABS
100.0000 ug | ORAL_TABLET | Freq: Every day | ORAL | Status: DC
  Administered 2015-05-13 – 2015-05-14 (×2): 100 ug via ORAL
  Filled 2015-05-13 (×2): qty 1

## 2015-05-13 MED ORDER — RISPERIDONE 0.5 MG PO TABS: 0.5000 mg | ORAL_TABLET | Freq: Two times a day (BID) | ORAL | Status: AC

## 2015-05-13 MED ORDER — CYANOCOBALAMIN 100 MCG PO TABS: 100.0000 ug | ORAL_TABLET | Freq: Every day | ORAL | Status: AC

## 2015-05-13 MED ORDER — POLYETHYLENE GLYCOL 3350 17 G PO PACK
17.0000 g | PACK | Freq: Every day | ORAL | Status: DC
  Administered 2015-05-13 – 2015-05-14 (×2): 17 g via ORAL
  Filled 2015-05-13 (×2): qty 1

## 2015-05-13 MED ORDER — FERROUS SULFATE 325 (65 FE) MG PO TABS: 325.0000 mg | ORAL_TABLET | Freq: Two times a day (BID) | ORAL | Status: AC

## 2015-05-13 MED ORDER — SENNOSIDES-DOCUSATE SODIUM 8.6-50 MG PO TABS: 1.0000 | ORAL_TABLET | Freq: Every day | ORAL | Status: AC

## 2015-05-13 MED ORDER — VITAMIN B-1 100 MG PO TABS
100.0000 mg | ORAL_TABLET | Freq: Every day | ORAL | Status: DC
  Administered 2015-05-13 – 2015-05-14 (×2): 100 mg via ORAL
  Filled 2015-05-13 (×2): qty 1

## 2015-05-13 MED ORDER — FERROUS SULFATE 325 (65 FE) MG PO TABS
325.0000 mg | ORAL_TABLET | Freq: Two times a day (BID) | ORAL | Status: DC
  Administered 2015-05-13 – 2015-05-14 (×3): 325 mg via ORAL
  Filled 2015-05-13 (×4): qty 1

## 2015-05-13 MED ORDER — LAMOTRIGINE 25 MG PO TABS: 25.0000 mg | ORAL_TABLET | Freq: Two times a day (BID) | ORAL | Status: AC

## 2015-05-13 MED ORDER — POLYETHYLENE GLYCOL 3350 17 G PO PACK: 17.0000 g | PACK | Freq: Every day | ORAL | Status: AC

## 2015-05-13 NOTE — Discharge Summary (Signed)
Discharge Summary  Jeremiah [name] VEL:[id number] DOB: 03/05/1956  PCP: No primary care provider on file.  Admit date: 05/12/2015 Discharge date: 05/14/2015  Time spent: <63mins  Recommendations for Outpatient Follow-up:  1. F/u with primary care physician within a week for hospital discharge follow up, repeat cbc/bmp at follow up.  2. F/u with psychiatry in two weeks for optimization of psychiatric medications.  Discharge Diagnoses:  Active Hospital Problems   Diagnosis Date Noted  . Paranoid schizophrenia 10/21/2014  . Anemia 05/12/2015    Resolved Hospital Problems   Diagnosis Date Noted Date Resolved  No resolved problems to display.    Discharge Condition: stable  Diet recommendation: heart healthy  Filed Weights   05/12/15 1315  Weight: 82.2 kg (181 lb 3.5 oz)    History of present illness:  Jeremiah [name] is a 59 y.o. male  with history of paranoid schizophrenia, hypertension, hyperlipidemia and hyponatremia was brought to the ER after patient became agitated at his nursing home, per nursing home report there was a recent change of his antipsychotic meds for unclear reason. Risperdal/cogentin which patient has been on for long time was recently stopped and he was started o lamictal and ativan a few weeks ago.  Patient was sent to the ED for further eval.  ED course: cbc showed hgb 8 (hgb was 12.8 in 10/2014), per EDP, rectal exam reviewed brown heme negative stool. UA ia pending,  Bmp showed hyponatremia, na of 128. EKG/cxr unremarkable. He received ivf, hospitalist called for admission.  Patient looks pale, but denies chest pain/sob/weakness/diziness. No cough, no fever, he does report being constipated, patient is oriented but not a reliable historian, does has psychomotor agitation during exam.  Hospital Course:  Principal Problem:   Paranoid schizophrenia Active Problems:   Anemia  Anemia (iron deficiency and low b12):  low b12, folate pending , +iron deficiency,  elevated retic, normal tsh, tbili wnl , less likely hemolysis.FOBT negative, ua no blood, no obvious source of blood loss, malnutrition? Start iron/b12 supplement. hgb increases to 9 from 8 after one unit prbc transfusion.  pmd to repeat cbc at follow up.   Hyponatremia: likely chronic, likely combination from psyh meds and dehydration. Tending up with ivf. Discharged with oral salt tabs for 5days. pmd to repeat bmp at follow up.  Paranoid schizophrenia: reported recent meds changes, psych consulted for meds assistance. Recommended:  "Psychosis: We start risperidone 0.5 mg twice daily Mood swings: Lamotrigine 25 mg twice daily" lamictal? Induced anemia? Is also a consideration, psych to continue monitor and optimize meds.  H/o HTN, not on meds at home, bp stable. Monitor.  DVT prophylaxis: scd  Consultants: psychiatry for meds assistance  Code Status: full   Family Communication: Patient     Procedures:  none  Consultations:  psychiatry  Discharge Exam: BP 130/73 mmHg  Pulse 106  Temp(Src) 98.7 F (37.1 C) (Oral)  Resp 16  Ht 6\' 2"  (1.88 m)  Wt 82.2 kg (181 lb 3.5 oz)  BMI 23.26 kg/m2  SpO2 97%   General: NAD, fidgeting  Cardiovascular: sinus tachycardia improved.  Respiratory: CTABL  Abdomen: Soft/ND/NT, positive BS  Musculoskeletal: No Edema  Neuro: oriented x3,   Psych: flat affect , fidgeting,gaurded, follow command,   Discharge Instructions You were cared for by a hospitalist during your hospital stay. If you have any questions about your discharge medications or the care you received while you were in the hospital after you are discharged, you can call the unit and asked to  speak with the hospitalist on call if the hospitalist that took care of you is not available. Once you are discharged, your primary care physician will handle any further medical issues. Please note that NO REFILLS for any discharge medications will be authorized once you  are discharged, as it is imperative that you return to your primary care physician (or establish a relationship with a primary care physician if you do not have one) for your aftercare needs so that they can reassess your need for medications and monitor your lab values.      Discharge Instructions    Diet - low sodium heart healthy    Complete by:  As directed      Increase activity slowly    Complete by:  As directed             Medication List    STOP taking these medications        benztropine 0.5 MG tablet  Commonly known as:  COGENTIN     LORazepam 0.5 MG tablet  Commonly known as:  ATIVAN     niacin 100 MG tablet      TAKE these medications        cyanocobalamin 100 MCG tablet  Take 1 tablet (100 mcg total) by mouth daily.     ferrous sulfate 325 (65 FE) MG tablet  Take 1 tablet (325 mg total) by mouth 2 (two) times daily with a meal.     lamoTRIgine 25 MG tablet  Commonly known as:  LAMICTAL  Take 1 tablet (25 mg total) by mouth 2 (two) times daily.     polyethylene glycol packet  Commonly known as:  MIRALAX / GLYCOLAX  Take 17 g by mouth daily.     risperiDONE 0.5 MG tablet  Commonly known as:  RISPERDAL  Take 1 tablet (0.5 mg total) by mouth 2 (two) times daily.     senna-docusate 8.6-50 MG per tablet  Commonly known as:  Senokot-S  Take 1 tablet by mouth at bedtime.     sodium chloride 1 G tablet  Take 1 tablet (1 g total) by mouth 2 (two) times daily with a meal.       No Known Allergies    The results of significant diagnostics from this hospitalization (including imaging, microbiology, ancillary and laboratory) are listed below for reference.    Significant Diagnostic Studies: Dg Chest 2 View  05/12/2015   CLINICAL DATA:  Agitation, pale  EXAM: CHEST  2 VIEW  COMPARISON:  10/20/2014  FINDINGS: Cardiomediastinal silhouette is stable. There is chronic mild elevation of the left hemidiaphragm. No acute infiltrate or pleural effusion. No  pulmonary edema. Osteopenia and degenerative changes thoracic spine. Stable mild compression deformity mid thoracic spine. There is significant compression deformity upper lumbar spine of indeterminate age. Clinical correlation is necessary.  IMPRESSION: There is chronic mild elevation of the left hemidiaphragm. No acute infiltrate or pleural effusion. No pulmonary edema. Osteopenia and degenerative changes thoracic spine. Stable mild compression deformity mid thoracic spine. There is significant compression deformity upper lumbar spine of indeterminate age. Clinical correlation is necessary.   Electronically Signed   By: Lahoma Crocker M.D.   On: 05/12/2015 11:31    Microbiology: No results found for this or any previous visit (from the past 240 hour(s)).   Labs: Basic Metabolic Panel:  Recent Labs Lab 05/12/15 1055 05/13/15 0505 05/14/15 0508  NA 128* 129* 131*  K 4.5 3.9 3.5  CL 95* 98* 96*  CO2 27 25 28   GLUCOSE 105* 110* 104*  BUN 12 12 8   CREATININE 0.65 0.57* 0.55*  CALCIUM 7.9* 7.7* 8.0*  MG  --   --  1.7   Liver Function Tests:  Recent Labs Lab 05/12/15 1055 05/13/15 0505  AST 18 15  ALT 15* 15*  ALKPHOS 70 65  BILITOT 0.4 0.2*  PROT 5.5* 5.5*  ALBUMIN 2.9* 2.7*   No results for input(s): LIPASE, AMYLASE in the last 168 hours. No results for input(s): AMMONIA in the last 168 hours. CBC:  Recent Labs Lab 05/12/15 1055 05/12/15 1221 05/13/15 0505 05/14/15 0508  WBC 8.4  --  10.3 9.5  NEUTROABS 4.5  --   --   --   HGB 8.0*  --  9.0* 8.9*  HCT 24.6* 21.5* 26.3* 27.0*  MCV 90.4  --  90.1 90.0  PLT 482*  --  523* 531*   Cardiac Enzymes: No results for input(s): CKTOTAL, CKMB, CKMBINDEX, TROPONINI in the last 168 hours. BNP: BNP (last 3 results) No results for input(s): BNP in the last 8760 hours.  ProBNP (last 3 results) No results for input(s): PROBNP in the last 8760 hours.  CBG: No results for input(s): GLUCAP in the last 168  hours.     SignedFlorencia Reasons MD, PhD  [facility] 05/14/2015, 8:53 AM

## 2015-05-13 NOTE — Clinical Social Work Psych Assess (Signed)
Clinical Social Work Nature conservation officer  Clinical Social Worker:  [name], [place] Date/Time:  05/13/2015, 2:36 PM Referred By:  Physician Date Referred:  05/13/15 Reason for Referral:  Behavioral Health Issues   Presenting Symptoms/Problems  Presenting Symptoms/Problems(in person's/family's own words):  Psych consulted for medication management.   Abuse/Neglect/Trauma History  Abuse/Neglect/Trauma History:  Denies History Abuse/Neglect/Trauma History Comments (indicate dates):  N/A   Psychiatric History  Psychiatric History:  Inpatient/Hospitalization, Outpatient Treatment Psychiatric Medication:  Lamictal, Cogentin, Ativan   Current Mental Health Hospitalizations/Previous Mental Health History:  Patient was diagnosed with schizophrenia when he was 25 or 59 years old.   Current Provider:  Dr. Emelda Brothers Place and Date:  Psychiatrist evaluates patient at SNF  Current Medications:  Scheduled Meds: . ferrous sulfate  325 mg Oral BID WC  . polyethylene glycol  17 g Oral Daily  . risperiDONE  0.5 mg Oral BID  . senna-docusate  1 tablet Oral BID  . thiamine  100 mg Oral Daily  . vitamin B-12  100 mcg Oral Daily   Continuous Infusions:  PRN Meds:.     Previous Inpatient Admission/Date/Reason:  Patient was hospitalized when he was 59 years old when he was first diagnosed with schizophrenia. Patient was placed at [facility] in [place] for several months. After being released, patient stayed with parents. Patient was hospitalized at [facility] about 10-12 years ago as well and [place] directly to group home from hospital.    Emotional Health/Current Symptoms  Suicide/Self Harm: None Reported Suicide Attempt in Past (date/description):  None reported  Other Harmful Behavior (ex. homicidal ideation) (describe):  N/A   Psychotic/Dissociative Symptoms  Psychotic/Dissociative Symptoms: None Reported Other Psychotic/Dissociative Symptoms:  Patient denies any  AVH.   Attention/Behavioral Symptoms  Attention/Behavioral Symptoms: Withdrawn Other Attention/Behavioral Symptoms:  Patient is a poor historian and unable to provide detailed information. Patient has delay when answering questions.   Cognitive Impairment  Cognitive Impairment:  Orientation - Self, Orientation - Place, Orientation - Time Other Cognitive Impairment:     Mood and Adjustment  Mood and Adjustment:  Flat   Stress, Anxiety, Trauma, Any Recent Loss/Stressor  Stress, Anxiety, Trauma, Any Recent Loss/Stressor: None Reported Anxiety (frequency):  N/A  Phobia (specify):  N/A  Compulsive Behavior (specify):  N/A  Obsessive Behavior (specify):  N/A  Other Stress, Anxiety, Trauma, Any Recent Loss/Stressor:  N/A   Substance Abuse/Use  Substance Abuse/Use: None SBIRT Completed (please refer for detailed history): No Self-reported Substance Use (last use and frequency):  None currently  Urinary Drug Screen Completed: No Alcohol Level:  N/A   Environment/Housing/Living Arrangement  Environmental/Housing/Living Arrangement: Green Who is in the Home:  [place]  Emergency Contact:  [name], engineering  Financial: Medicare   Patient's Strengths and Goals  Patient's Strengths and Goals (patient's own words):  Patient has supportive family and stable housing.   Clinical Social Worker's Interpretive Summary  Clinical Social Workers Interpretive Summary:    CSW received referral in order to complete psychosocial assessment. CSW reviewed chart and met with patient at bedside with psych MD. Patient laying in bed and withdrawn during assessment. Patient unable to provide background information on MH concerns. Patient denies any SI or HI or psychotic features.  CSW spoke with sister Jeremiah [name]) via phone. Sister lives on [place] and has not seen patient since late May but able to provide collateral information. Patient was diagnosed with  schizophrenia when he was [age] years old. Patient was sent to psychiatric  hospital for several months and then left hospital and lived with parents. Patient lived with parents for several years but once parents got older and had medical problems, patient was placed in [facility] for further treatment. Patient had positive experience at [facility] and medications were adjusted which caused patient to become more active. [facility] placed patient in group home at [place] since parents were unable to care for patient. Patient stayed in group home in [facility] for about 10 years. Patient had a back injury and declines surgery. Family is agreeable that patient should not undergo surgery and was placed in SNF for rehab. Patient continues to use wheelchair and has completed some therapy to where he can walk at times. Patient has done well at SNF but remains quiet and isolated at times.  Patient had been on Risperdal for several years but when his medication changed, new insurance was asking for authorization for medication each month. PCP at SNF changed Risperdal to Lamictal on July 25th due to authorization problems. Psychiatrist is on vacation so PCP approved changed. SNF and family agreeable for patient to restart Risperdal because they feel patient's MH has declined. Patient has become more restless and not sleeping as well.   CSW provided psych MD with information who will make medication recommendations.     Disposition  Disposition: Recommend Psych CSW Continuing To Support While In [facility] [place]

## 2015-05-13 NOTE — Consult Note (Signed)
[facility] Consult   Reason for Consult:  Paranoid schizophrenia with agitation Referring Physician:  Dr. Erlinda Hong Patient Identification: Jeremiah [name] MRN:  [id number] Principal Diagnosis: Paranoid schizophrenia Diagnosis:   Patient Active Problem List   Diagnosis Date Noted  . Anemia [D64.9] 05/12/2015  . Fall [W19.XXXA]   . Metabolic encephalopathy [O67.67] 10/21/2014  . Paranoid schizophrenia [F20.0] 10/21/2014  . Hyperlipidemia [E78.5] 10/21/2014  . Essential hypertension [I10] 10/21/2014  . 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 Elevated CPK [R74.8] 10/21/2014  . Hyponatremia [E87.1] 10/20/2014    Total Time spent with patient: 1 hour  Subjective:   Jeremiah [name] is a 59 y.o. male patient admitted with agitation and recent psych medication changes.  HPI:  Jeremiah [name] is a 59 y.o. male seen face-to-face for the psychiatric evaluation along with the psychiatric clinical social service for recently increased agitation, bizarre behaviors and disorganized thoughts. Reportedly he is a resident of skilled nursing home and his medication was recently changed. Group home staff requested to readjust his medication for managing his disorganized, paranoid, bizarre behaviors and agitation. Patient is a poor historian he was not able to give details about his medical problems are mental health problems and medication treatments at this time. Psychiatric social service will contact the patient's sister who seems to be his guardian, nursing home and previous providers for appropriate collateral information. Patient denies current suicidal/homicidal ideation, intention or plans. Patient has low hemoglobin and hematocrit and also hyponatremia. Patient was previously seen for same clinical situation about 6 months ago but does not required acute psychiatric hospitalization.  HPI Elements:  Location:  Psychosis and agitation. Quality:  Poor. Severity:  Recently increased bizarre behavior and  agitation. Timing:  Medication changes. Duration:  Few months. Context:  Chronic medical and mental illness with recent exacerbation.  Past Medical History:  Past Medical History  Diagnosis Date  . Paranoid schizophrenia   . Osteoporosis   . Respiratory failure hypoxic  . Vitamin D deficiency   . Hypertension   . Hyperlipidemia   . Hypogonadism in male   . Pneumonia healthcare-associated  . Hyponatremia   . Fracture spine    Past Surgical History  Procedure Laterality Date  . No past surgeries     Family History:  Family History  Problem Relation Age of Onset  . Family history unknown: Yes   Social History:  History  Alcohol Use No     History  Drug Use No    History   Social History  . Marital Status: Single    Spouse Name: N/A  . Number of Children: N/A  . Years of Education: N/A   Social History Main Topics  . Smoking status: Former Research scientist (life sciences)  . Smokeless tobacco: Never Used  . Alcohol Use: No  . Drug Use: No  . Sexual Activity: No   Other Topics Concern  . None   Social History Narrative   Additional Social History:     Allergies:  No Known Allergies  Labs:  Results for orders placed or performed during the hospital encounter of 05/12/15 (from the past 48 hour(s))  CBC with Differential     Status: Abnormal   Collection Time: 05/12/15 10:55 AM  Result Value Ref Range   WBC 8.4 4.0 - 10.5 K/uL   RBC 2.72 (L) 4.22 - 5.81 MIL/uL   Hemoglobin 8.0 (L) 13.0 - 17.0 g/dL   HCT 24.6 (L) 39.0 - 52.0 %   MCV 90.4 78.0 - 100.0 fL  MCH 29.4 26.0 - 34.0 pg   MCHC 32.5 30.0 - 36.0 g/dL   RDW 15.6 (H) 11.5 - 15.5 %   Platelets 482 (H) 150 - 400 K/uL   Neutrophils Relative % 55 43 - 77 %   Neutro Abs 4.5 1.7 - 7.7 K/uL   Lymphocytes Relative 23 12 - 46 %   Lymphs Abs 1.9 0.7 - 4.0 K/uL   Monocytes Relative 21 (H) 3 - 12 %   Monocytes Absolute 1.8 (H) 0.1 - 1.0 K/uL   Eosinophils Relative 1 0 - 5 %   Eosinophils Absolute 0.1 0.0 - 0.7 K/uL   Basophils  Relative 0 0 - 1 %   Basophils Absolute 0.0 0.0 - 0.1 K/uL  Comprehensive metabolic panel     Status: Abnormal   Collection Time: 05/12/15 10:55 AM  Result Value Ref Range   Sodium 128 (L) 135 - 145 mmol/L   Potassium 4.5 3.5 - 5.1 mmol/L   Chloride 95 (L) 101 - 111 mmol/L   CO2 27 22 - 32 mmol/L   Glucose, Bld 105 (H) 65 - 99 mg/dL   BUN 12 6 - 20 mg/dL   Creatinine, Ser 0.65 0.61 - 1.24 mg/dL   Calcium 7.9 (L) 8.9 - 10.3 mg/dL   Total Protein 5.5 (L) 6.5 - 8.1 g/dL   Albumin 2.9 (L) 3.5 - 5.0 g/dL   AST 18 15 - 41 U/L   ALT 15 (L) 17 - 63 U/L   Alkaline Phosphatase 70 38 - 126 U/L   Total Bilirubin 0.4 0.3 - 1.2 mg/dL   GFR calc non Af Amer >60 >60 mL/min   GFR calc Af Amer >60 >60 mL/min    Comment: (NOTE) The eGFR has been calculated using the CKD EPI equation. This calculation has not been validated in all clinical situations. eGFR's persistently <60 mL/min signify possible Chronic Kidney Disease.    Anion gap 6 5 - 15  POC occult blood, ED Provider will collect     Status: None   Collection Time: 05/12/15 11:55 AM  Result Value Ref Range   Fecal Occult Bld NEGATIVE NEGATIVE  Urinalysis, Routine w reflex microscopic (not at [facility])     Status: Abnormal   Collection Time: 05/12/15 12:19 PM  Result Value Ref Range   Color, Urine YELLOW YELLOW   APPearance CLOUDY (A) CLEAR   Specific Gravity, Urine 1.016 1.005 - 1.030   pH 7.0 5.0 - 8.0   Glucose, UA NEGATIVE NEGATIVE mg/dL   Hgb urine dipstick NEGATIVE NEGATIVE   Bilirubin Urine NEGATIVE NEGATIVE   Ketones, ur NEGATIVE NEGATIVE mg/dL   Protein, ur NEGATIVE NEGATIVE mg/dL   Urobilinogen, UA 0.2 0.0 - 1.0 mg/dL   Nitrite NEGATIVE NEGATIVE   Leukocytes, UA NEGATIVE NEGATIVE    Comment: MICROSCOPIC NOT DONE ON URINES WITH NEGATIVE PROTEIN, BLOOD, LEUKOCYTES, NITRITE, OR GLUCOSE <1000 mg/dL.  Reticulocytes     Status: Abnormal   Collection Time: 05/12/15 12:22 PM  Result Value Ref Range   Retic Ct Pct 8.9 (H) 0.4 - 3.1  %   RBC. 2.37 (L) 4.22 - 5.81 MIL/uL   Retic Count, Manual 210.9 (H) 19.0 - 186.0 K/uL  Vitamin B12     Status: Abnormal   Collection Time: 05/12/15 12:22 PM  Result Value Ref Range   Vitamin B-12 173 (L) 180 - 914 pg/mL    Comment: (NOTE) This assay is not validated for testing neonatal or myeloproliferative syndrome specimens for Vitamin B12 levels. Performed  at [facility]   Iron and TIBC     Status: Abnormal   Collection Time: 05/12/15 12:22 PM  Result Value Ref Range   Iron 22 (L) 45 - 182 ug/dL   TIBC 218 (L) 250 - 450 ug/dL   Saturation Ratios 10 (L) 17.9 - 39.5 %   UIBC 196 ug/dL    Comment: Performed at [facility]  TSH     Status: None   Collection Time: 05/12/15 12:22 PM  Result Value Ref Range   TSH 0.829 0.350 - 4.500 uIU/mL  Type and screen     Status: None   Collection Time: 05/12/15 12:22 PM  Result Value Ref Range   ABO/RH(D) O POS    Antibody Screen NEG    Sample Expiration 05/15/2015    Unit Number [id number]    Blood Component Type RED CELLS,LR    Unit division 00    Status of Unit ISSUED,FINAL    Transfusion Status OK TO TRANSFUSE    Crossmatch Result Compatible   ABO/Rh     Status: None   Collection Time: 05/12/15 12:22 PM  Result Value Ref Range   ABO/RH(D) O POS   Protime-INR     Status: None   Collection Time: 05/12/15 12:23 PM  Result Value Ref Range   Prothrombin Time 13.5 11.6 - 15.2 seconds   INR 1.01 0.00 - 1.49  Prepare RBC     Status: None   Collection Time: 05/12/15  2:00 PM  Result Value Ref Range   Order Confirmation ORDER PROCESSED BY BLOOD BANK   Comprehensive metabolic panel     Status: Abnormal   Collection Time: 05/13/15  5:05 AM  Result Value Ref Range   Sodium 129 (L) 135 - 145 mmol/L   Potassium 3.9 3.5 - 5.1 mmol/L   Chloride 98 (L) 101 - 111 mmol/L   CO2 25 22 - 32 mmol/L   Glucose, Bld 110 (H) 65 - 99 mg/dL   BUN 12 6 - 20 mg/dL   Creatinine, Ser 0.57 (L) 0.61 - 1.24 mg/dL   Calcium 7.7 (L) 8.9  - 10.3 mg/dL   Total Protein 5.5 (L) 6.5 - 8.1 g/dL   Albumin 2.7 (L) 3.5 - 5.0 g/dL   AST 15 15 - 41 U/L   ALT 15 (L) 17 - 63 U/L   Alkaline Phosphatase 65 38 - 126 U/L   Total Bilirubin 0.2 (L) 0.3 - 1.2 mg/dL   GFR calc non Af Amer >60 >60 mL/min   GFR calc Af Amer >60 >60 mL/min    Comment: (NOTE) The eGFR has been calculated using the CKD EPI equation. This calculation has not been validated in all clinical situations. eGFR's persistently <60 mL/min signify possible Chronic Kidney Disease.    Anion gap 6 5 - 15  CBC     Status: Abnormal   Collection Time: 05/13/15  5:05 AM  Result Value Ref Range   WBC 10.3 4.0 - 10.5 K/uL   RBC 2.92 (L) 4.22 - 5.81 MIL/uL   Hemoglobin 9.0 (L) 13.0 - 17.0 g/dL   HCT 26.3 (L) 39.0 - 52.0 %   MCV 90.1 78.0 - 100.0 fL   MCH 30.8 26.0 - 34.0 pg   MCHC 34.2 30.0 - 36.0 g/dL   RDW 15.9 (H) 11.5 - 15.5 %   Platelets 523 (H) 150 - 400 K/uL  Protime-INR     Status: None   Collection Time: 05/13/15  5:05 AM  Result  Value Ref Range   Prothrombin Time 13.3 11.6 - 15.2 seconds   INR 0.99 0.00 - 1.49    Vitals: Blood pressure 133/73, pulse 115, temperature 98.6 F (37 C), temperature source Oral, resp. rate 16, height _0  (1.88 m), weight 82.2 kg (181 lb 3.5 oz), SpO2 97 %.  Risk to Self: Is patient at risk for suicide?: No Risk to Others:   Prior Inpatient Therapy:   Prior Outpatient Therapy:    Current Facility-Administered Medications  Medication Dose Route Frequency Provider Last Rate Last Dose  . ferrous sulfate tablet 325 mg  325 mg Oral BID WC Florencia Reasons, MD   325 mg at 05/13/15 0807  . polyethylene glycol (MIRALAX / GLYCOLAX) packet 17 g  17 g Oral Daily Florencia Reasons, MD   17 g at 05/13/15 1040  . senna-docusate (Senokot-S) tablet 1 tablet  1 tablet Oral BID Florencia Reasons, MD   1 tablet at 05/13/15 0807  . thiamine (VITAMIN B-1) tablet 100 mg  100 mg Oral Daily Florencia Reasons, MD   100 mg at 05/13/15 1039  . vitamin B-12 (CYANOCOBALAMIN) tablet 100 mcg   100 mcg Oral Daily Florencia Reasons, MD   100 mcg at 05/13/15 1039    Musculoskeletal: Strength & Muscle Tone: decreased Gait & Station: unable to stand Patient leans: N/A  Psychiatric Specialty Exam: Physical Exam as per history and physical   ROS generalized weakness denied nausea, vomiting's, shortness of breath and chest pain No Fever-chills, No Headache, No changes with Vision or hearing, reports vertigo No problems swallowing food or Liquids, No Chest pain, Cough or Shortness of Breath, No Abdominal pain, No Nausea or Vommitting, Bowel movements are regular, No Blood in stool or Urine, No dysuria, No new skin rashes or bruises, No new joints pains-aches,  No new weakness, tingling, numbness in any extremity, No recent weight gain or loss, No polyuria, polydypsia or polyphagia,   A full 10 point Review of Systems was done, except as stated above, all other Review of Systems were negative.  Blood pressure 133/73, pulse 115, temperature 98.6 F (37 C), temperature source Oral, resp. rate 16, height _1  (1.88 m), weight 82.2 kg (181 lb 3.5 oz), SpO2 97 %.Body mass index is 23.26 kg/(m^2).  General Appearance: Bizarre, Disheveled and Guarded  Eye Contact::  Fair  Speech:  Blocked and Clear and Coherent  Volume:  Decreased  Mood:  Depressed and Irritable  Affect:  Constricted and Depressed  Thought Process:  Coherent and Goal Directed  Orientation:  Full (Time, Place, and Person)  Thought Content:  WDL  Suicidal Thoughts:  No  Homicidal Thoughts:  No  Memory:  Immediate;   Fair Recent;   Poor  Judgement:  Impaired  Insight:  Lacking  Psychomotor Activity:  Restlessness  Concentration:  Poor  Recall:  [place] of Knowledge:Fair  Language: Good  Akathisia:  Negative  Handed:  Right  AIMS (if indicated):     Assets:  Desire for Improvement Financial Resources/Insurance Housing Intimacy Leisure Time Resilience Social Support Talents/Skills Transportation  ADL's:   Impaired  Cognition: Impaired,  Mild  Sleep:      Medical Decision Making: Review of Psycho-Social Stressors (1), Review or order clinical lab tests (1), Established Problem, Worsening (2), Review of Last Therapy Session (1), Review or order medicine tests (1), Review of Medication Regimen & Side Effects (2) and Review of New Medication or Change in Dosage (2)  Treatment Plan Summary: Patient presented with increased  bizarre behaviors, agitation and disorganized thoughts secondary to medication changes recently in the facility. Patient also presented with the hyponatremia and anemia. Patient denies any suicidal or homicidal ideation, intention or plans. Daily contact with patient to assess and evaluate symptoms and progress in treatment and Medication management  Plan: Psychosis: We start risperidone 0.5 mg twice daily Mood swings: Lamotrigine 25 mg twice daily Safety concerns: Patient has no safety concerns Patient does not meet criteria for psychiatric inpatient admission. Supportive therapy provided about ongoing stressors.  Appreciate psychiatric consultation and follow up as clinically required Please contact [phone] or [phone] if needs further assistance  Disposition: Patient can be discharged to the nursing home when he is medically stable and he does not meet criteria for acute psychiatric hospitalization.  Mazelle Huebert,JANARDHAHA R. 05/13/2015 10:56 AM

## 2015-05-13 NOTE — Progress Notes (Signed)
PROGRESS NOTE  Jeremiah [name] HAL:[id number] DOB: 11/17/1955 DOA: 05/12/2015 PCP: No primary care provider on file.  HPI/Recap of past 24 hours:  C/o constipation, does not want enema,  denies pain, poor historian  Assessment/Plan: Active Problems:   Anemia  Anemia, normocytic,   Anemia work up : low b12, folate pending , +iron deficiency, elevated retic, normal tsh, tbili wnl , less likely hemolysis.  hgb increases to 9 from 8 after one unit prbc. Start iron/b12 supplement. Drug side effect from lamictal? Is also a consideration,will f/u psych recommendations.  Hyponatremia: likely chronic, likely combination from psyh meds and dehydration. Tending up with ivf   Paranoid schizophrenia: reported recent meds changes, psych consulted for meds assistance.  H/o HTN, not on meds at home, bp stable. Monitor.  DVT prophylaxis: scd  Consultants: psychiatry for meds assistance  Code Status: full   Family Communication: Patient    Disposition Plan: remain in the hospital, possible d/c in am   Procedures:  none  Antibiotics:  none   Objective: BP 133/73 mmHg  Pulse 115  Temp(Src) 98.6 F (37 C) (Oral)  Resp 16  Ht 6\' 2"  (1.88 m)  Wt 82.2 kg (181 lb 3.5 oz)  BMI 23.26 kg/m2  SpO2 97%  Intake/Output Summary (Last 24 hours) at 05/13/15 0931 Last data filed at 05/13/15 0809  Gross per 24 hour  Intake 2501.25 ml  Output   2685 ml  Net -183.75 ml   Filed Weights   05/12/15 1315  Weight: 82.2 kg (181 lb 3.5 oz)    Exam:   General:  NAD, fidgeting  Cardiovascular: sinus tachycardia  Respiratory: CTABL  Abdomen: Soft/ND/NT, positive BS  Musculoskeletal: No Edema  Neuro: oriented x3,   Psych: flat affect , fidgeting,gaurded, follow command,   Data Reviewed: Basic Metabolic Panel:  Recent Labs Lab 05/12/15 1055 05/13/15 0505  NA 128* 129*  K 4.5 3.9  CL 95* 98*  CO2 27 25  GLUCOSE 105* 110*  BUN 12 12  CREATININE 0.65 0.57*  CALCIUM 7.9*  7.7*   Liver Function Tests:  Recent Labs Lab 05/12/15 1055 05/13/15 0505  AST 18 15  ALT 15* 15*  ALKPHOS 70 65  BILITOT 0.4 0.2*  PROT 5.5* 5.5*  ALBUMIN 2.9* 2.7*   No results for input(s): LIPASE, AMYLASE in the last 168 hours. No results for input(s): AMMONIA in the last 168 hours. CBC:  Recent Labs Lab 05/12/15 1055 05/13/15 0505  WBC 8.4 10.3  NEUTROABS 4.5  --   HGB 8.0* 9.0*  HCT 24.6* 26.3*  MCV 90.4 90.1  PLT 482* 523*   Cardiac Enzymes:   No results for input(s): CKTOTAL, CKMB, CKMBINDEX, TROPONINI in the last 168 hours. BNP (last 3 results) No results for input(s): BNP in the last 8760 hours.  ProBNP (last 3 results) No results for input(s): PROBNP in the last 8760 hours.  CBG: No results for input(s): GLUCAP in the last 168 hours.  No results found for this or any previous visit (from the past 240 hour(s)).   Studies: Dg Chest 2 View  05/12/2015   CLINICAL DATA:  Agitation, pale  EXAM: CHEST  2 VIEW  COMPARISON:  10/20/2014  FINDINGS: Cardiomediastinal silhouette is stable. There is chronic mild elevation of the left hemidiaphragm. No acute infiltrate or pleural effusion. No pulmonary edema. Osteopenia and degenerative changes thoracic spine. Stable mild compression deformity mid thoracic spine. There is significant compression deformity upper lumbar spine of indeterminate age. Clinical correlation is necessary.  IMPRESSION: There is chronic mild elevation of the left hemidiaphragm. No acute infiltrate or pleural effusion. No pulmonary edema. Osteopenia and degenerative changes thoracic spine. Stable mild compression deformity mid thoracic spine. There is significant compression deformity upper lumbar spine of indeterminate age. Clinical correlation is necessary.   Electronically Signed   By: Lahoma Crocker M.D.   On: 05/12/2015 11:31    Scheduled Meds: . ferrous sulfate  325 mg Oral BID WC  . senna-docusate  1 tablet Oral BID    Continuous Infusions:     Time spent: 49mins  [name] MD, PhD  [facility] Pager [phone]. If 7PM-7AM, please contact night-coverage at [email or web address], password [facility] 05/13/2015, 9:31 AM  LOS: 1 day

## 2015-05-14 LAB — MAGNESIUM: Magnesium: 1.7 mg/dL (ref 1.7–2.4)

## 2015-05-14 LAB — CBC
HEMATOCRIT: 27 % — AB (ref 39.0–52.0)
HEMOGLOBIN: 8.9 g/dL — AB (ref 13.0–17.0)
MCH: 29.7 pg (ref 26.0–34.0)
MCHC: 33 g/dL (ref 30.0–36.0)
MCV: 90 fL (ref 78.0–100.0)
PLATELETS: 531 10*3/uL — AB (ref 150–400)
RBC: 3 MIL/uL — AB (ref 4.22–5.81)
RDW: 16.1 % — ABNORMAL HIGH (ref 11.5–15.5)
WBC: 9.5 10*3/uL (ref 4.0–10.5)

## 2015-05-14 LAB — BASIC METABOLIC PANEL
ANION GAP: 7 (ref 5–15)
BUN: 8 mg/dL (ref 6–20)
CALCIUM: 8 mg/dL — AB (ref 8.9–10.3)
CHLORIDE: 96 mmol/L — AB (ref 101–111)
CO2: 28 mmol/L (ref 22–32)
Creatinine, Ser: 0.55 mg/dL — ABNORMAL LOW (ref 0.61–1.24)
GFR calc Af Amer: >60 mL/min (ref >60)
GFR calc non Af Amer: >60 mL/min (ref >60)
GLUCOSE: 104 mg/dL — AB (ref 65–99)
POTASSIUM: 3.5 mmol/L (ref 3.5–5.1)
Sodium: 131 mmol/L — ABNORMAL LOW (ref 135–145)

## 2015-05-14 MED ORDER — SODIUM CHLORIDE 1 G PO TABS: 1.0000 g | ORAL_TABLET | Freq: Two times a day (BID) | ORAL | Status: AC

## 2015-05-14 NOTE — Progress Notes (Signed)
PT Cancellation Note  Patient Details Name: Jeremiah [name] MRN: [id number] DOB: December 19, 1955   Cancelled Treatment:    Reason Eval/Treat Not Completed: PT screened, no needs identified, will sign off (patient would not cooperate with evaluatiion,. Patient is returning to snf today. RN aware.)   Claretha Cooper 05/14/2015, 11:27 AM Tresa Endo PT [phone]

## 2015-05-14 NOTE — Clinical Social Work Note (Signed)
CSW received a call that pt was ready to D/C back to SNF  CSW caled [facility] to coordinate pt's return  CSW prepared packet, provided it to RN, met with pt to confirm transportation disposition and called for ambulance transport.   No further CSW needs  CSW signing off  .Dede Query, LCSW [facility] Clinical Social Worker - Weekend Coverage cell #: [phone]

## 2015-05-14 NOTE — Progress Notes (Signed)
Report called to [facility] at [facility]. Given to nursing supervisor Lelon Frohlich. Pt will be transported in stable condition via [facility].  Cathie Hoops, RN

## 2015-06-09 DEATH — deceased

## 2016-05-24 IMAGING — CR DG CHEST 1V PORT
1 series · 1 of 1 positions shown · non-contrast
Comparison: 08/21/2014 and 08/19/2014 radiographs; CT 08/03/2014.

CLINICAL DATA: Altered mental status for 1 day. History of
hypertension and smoking. Initial encounter.

EXAM:
PORTABLE CHEST - 1 VIEW

[AP]
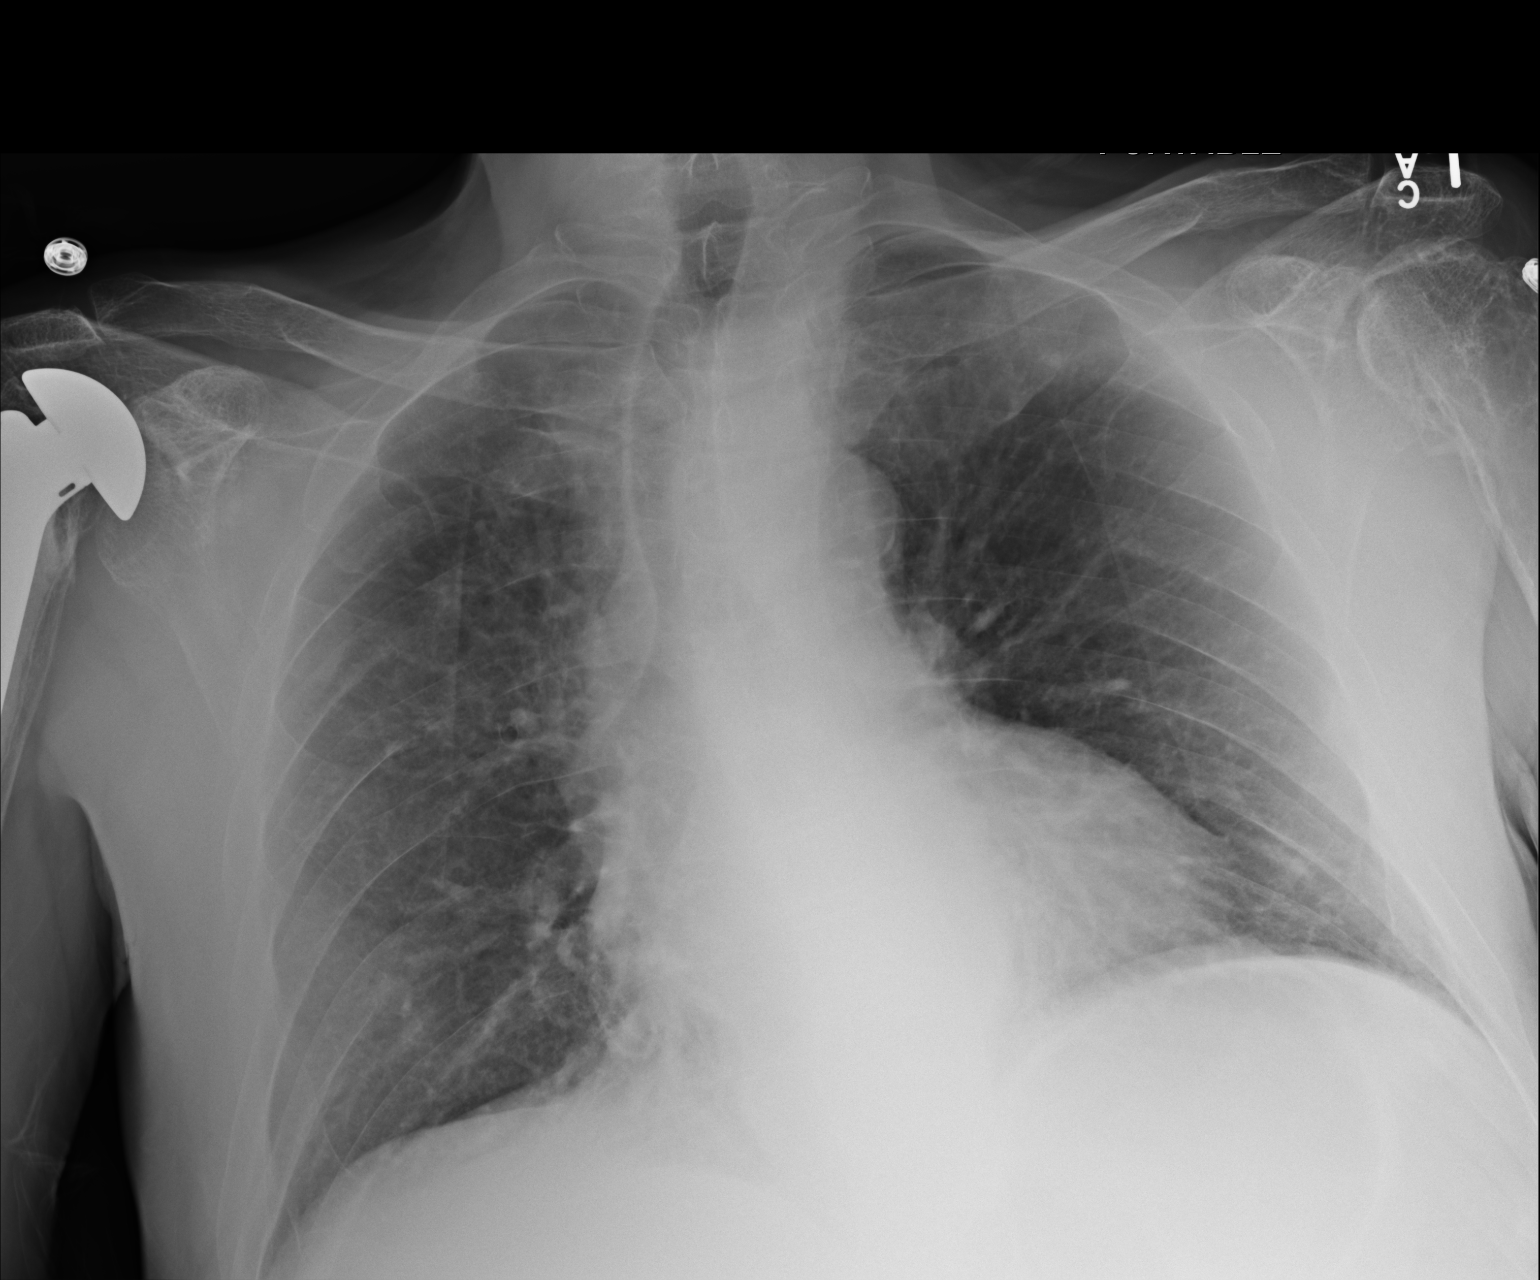

[1 of 1 positions shown; findings below may reference images not displayed]

FINDINGS: 9049 hr. The heart size and mediastinal contours appear stable
allowing for some patient rotation to the right. This is likely
responsible for mild tracheal deviation to the right. The pulmonary
edema noted previously has resolved. There is evidence of underlying
mild emphysema. No superimposed airspace disease or pleural effusion
identified. Advanced glenohumeral degenerative changes are present
on the left. The patient is status post right total shoulder
arthroplasty.
IMPRESSION: No acute cardiopulmonary process.  Mild emphysema.

## 2016-05-25 IMAGING — CT CT HEAD W/O CM
3 of 6 series · 14 of 47 positions shown, 16 images · non-contrast
Comparison: Head CT 08/19/2014.  Cervical spine MRI 08/03/2014.

CLINICAL DATA: Fall.  Initial encounter.

EXAM:
CT HEAD WITHOUT CONTRAST
CT CERVICAL SPINE WITHOUT CONTRAST
TECHNIQUE: Multidetector CT imaging of the head and cervical spine was
performed following the standard protocol without intravenous
contrast. Multiplanar CT image reconstructions of the cervical spine
were also generated.

[Series 9: coronals · coronal · 0.39mm/px · 3 of 55 slices shown]
[im 19/55  brain]
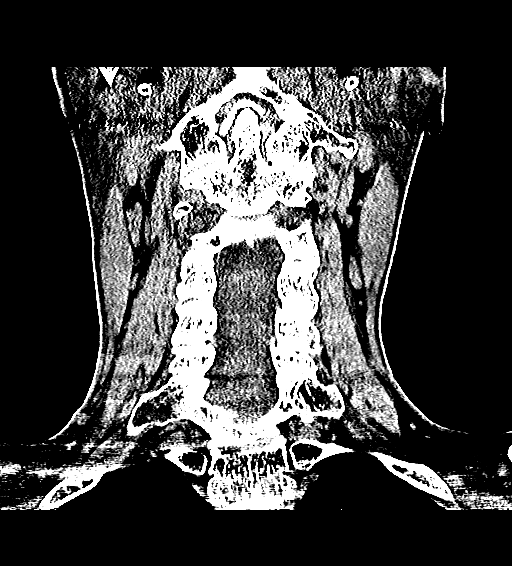
[im 25/55  brain]
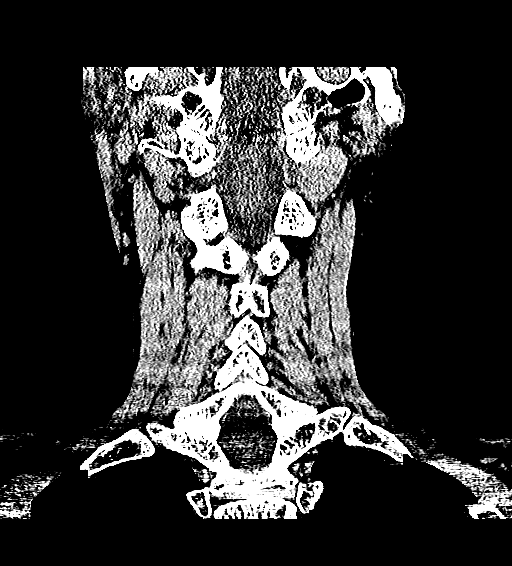
[im 31/55  brain]
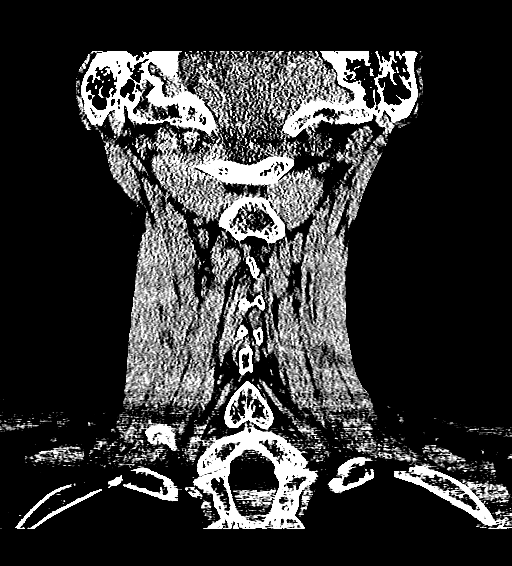

[Series 10: sagittals · sagittal · 0.27mm/px · 3 of 50 slices shown]
[im 17/50  brain]
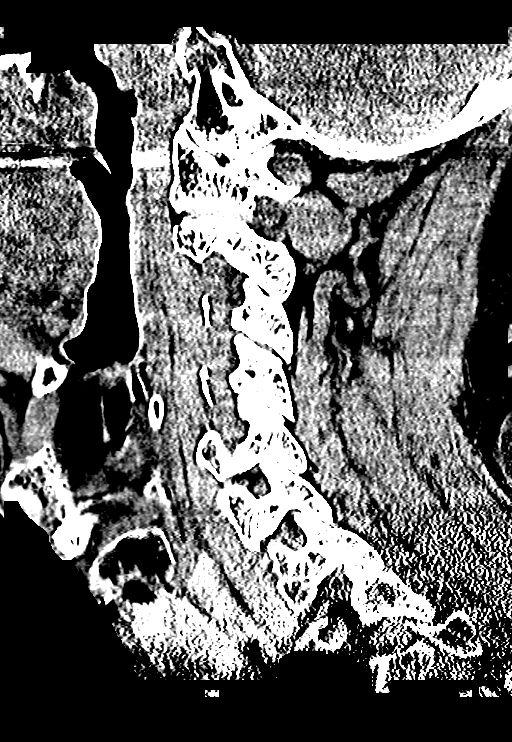
[im 25/50  brain]
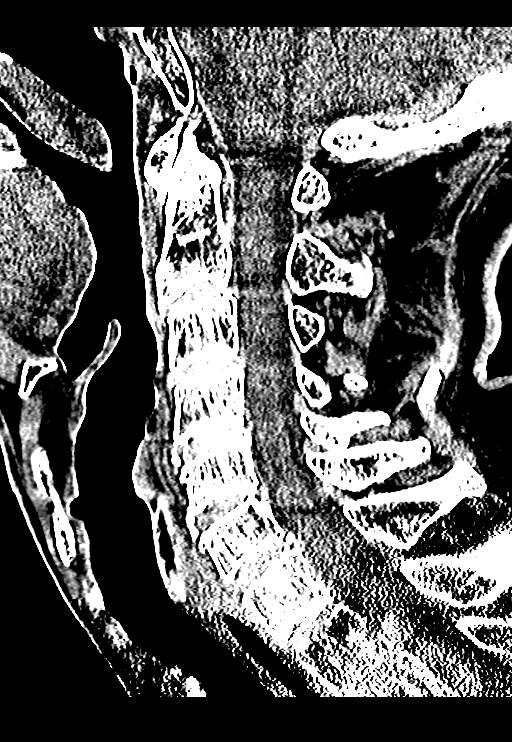
[im 33/50  brain]
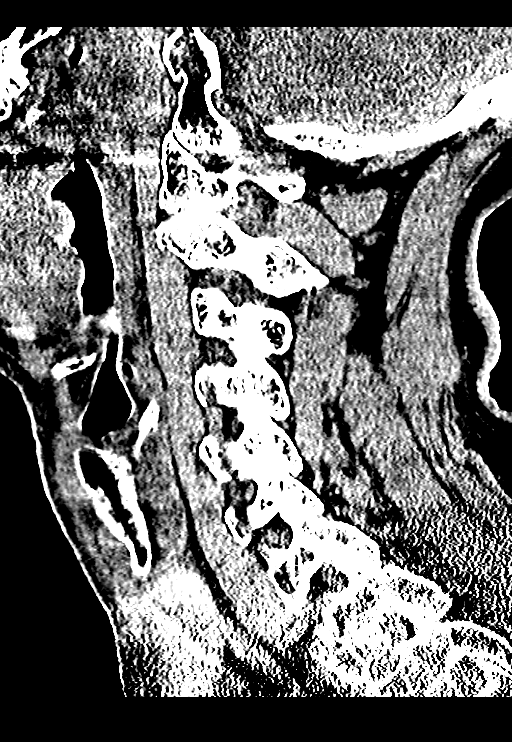

[Series 11: orthogonals · axial · 0.23mm/px · z∈[-334,-185]mm · 8 of 95 slices shown, 10 images]
[im 9/95  brain]
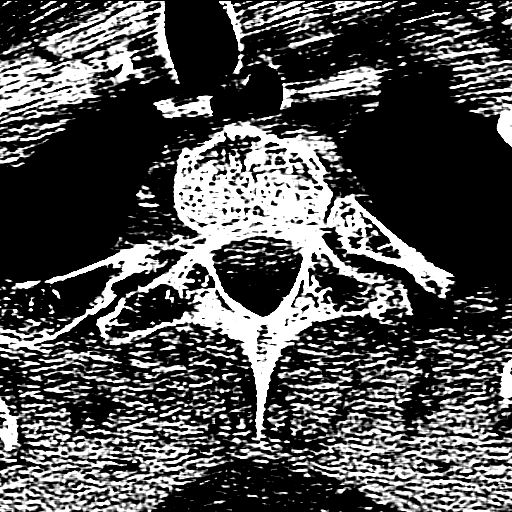
[im 9/95  bone]
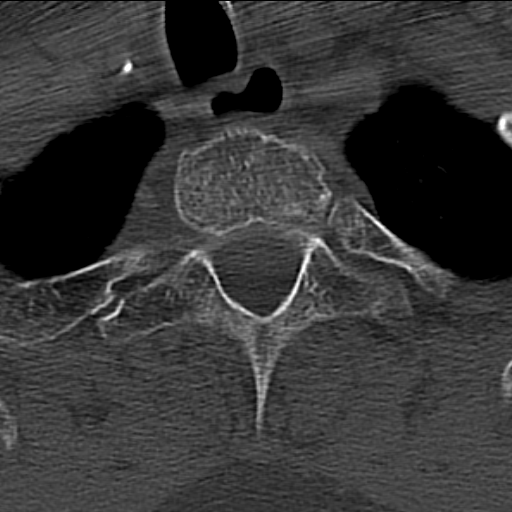
[im 18/95  brain]
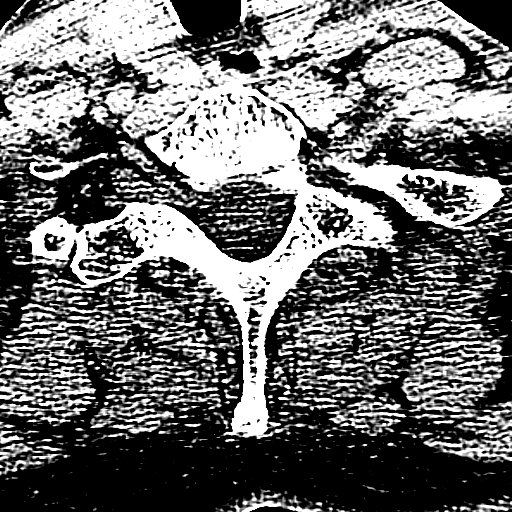
[im 35/95  brain]
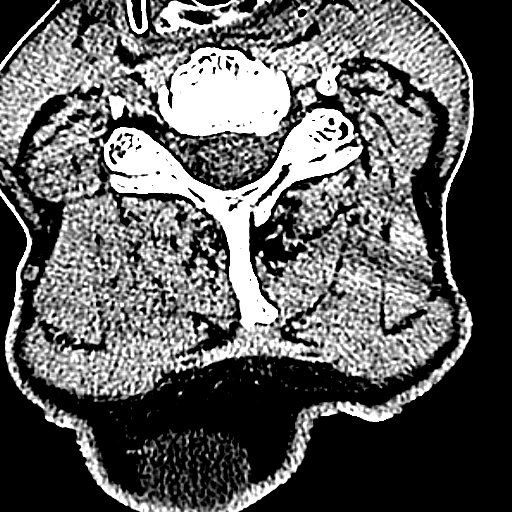
[im 43/95  brain]
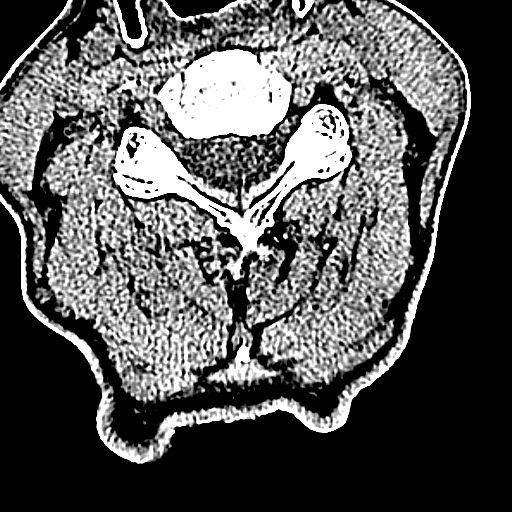
[im 52/95  brain]
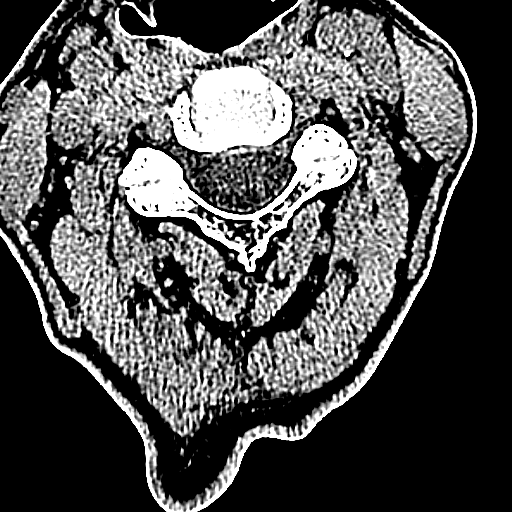
[im 52/95  bone]
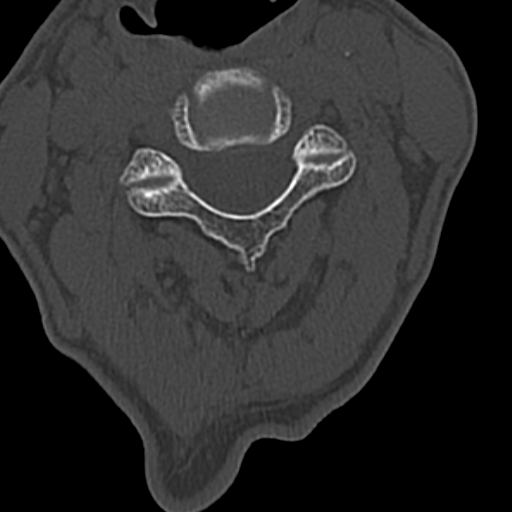
[im 60/95  brain]
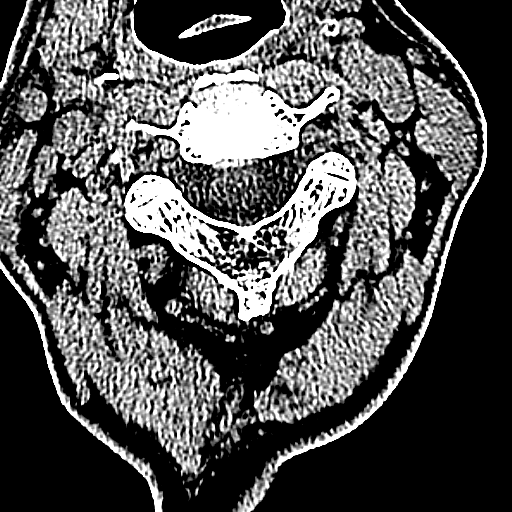
[im 77/95  brain]
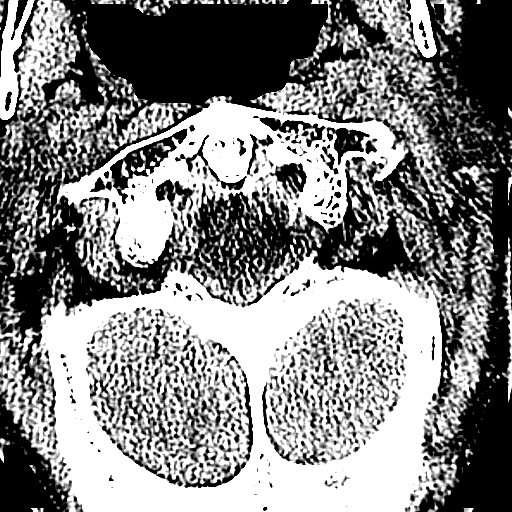
[im 86/95  brain]
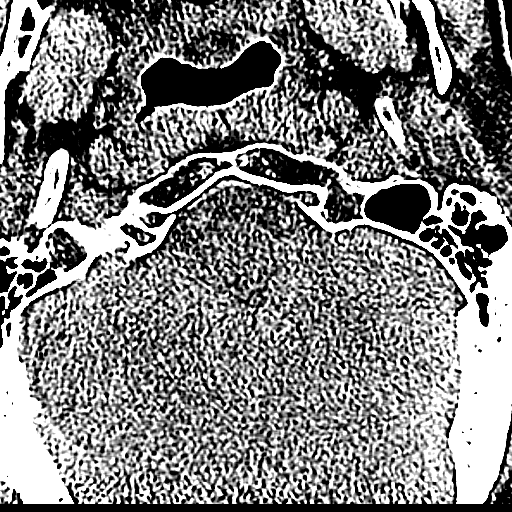

[14 of 47 positions shown; findings below may reference images not displayed]

FINDINGS: CT HEAD FINDINGS

There is no evidence of acute cortical infarct, intracranial
hemorrhage, mass, midline shift, or extra-axial fluid collection.
Mild cerebral atrophy is unchanged. Orbits are unremarkable. Mastoid
air cells are clear. Trace left frontal sinus mucosal thickening is
unchanged. No skull fracture is identified.

CT CERVICAL SPINE FINDINGS

Vertebral alignment is unchanged without listhesis. The bones are
diffusely osteopenic. No cervical spine fracture is identified.
Lucent lesion with suggestion of coarse trabeculations in the left
C7 articular pillar may represent a hemangioma as described on prior
MRI. Mild disc space narrowing is present C5-6. Focal ossification
is noted in the nuchal ligament at the C5 level. 4.2 x 3.5 cm
sebaceous cyst is noted in the posterior neck. The visualized lung
apices are clear. Moderate carotid bifurcation calcification is
noted bilaterally.
IMPRESSION: 1. No evidence of acute intracranial abnormality.
2. No acute osseous abnormality identified in the cervical spine.

## 2016-12-14 IMAGING — CR DG CHEST 2V
2 series · 2 of 2 positions shown · non-contrast
Comparison: 10/20/2014

CLINICAL DATA: Agitation, pale

EXAM:
CHEST  2 VIEW

[w chest pa]
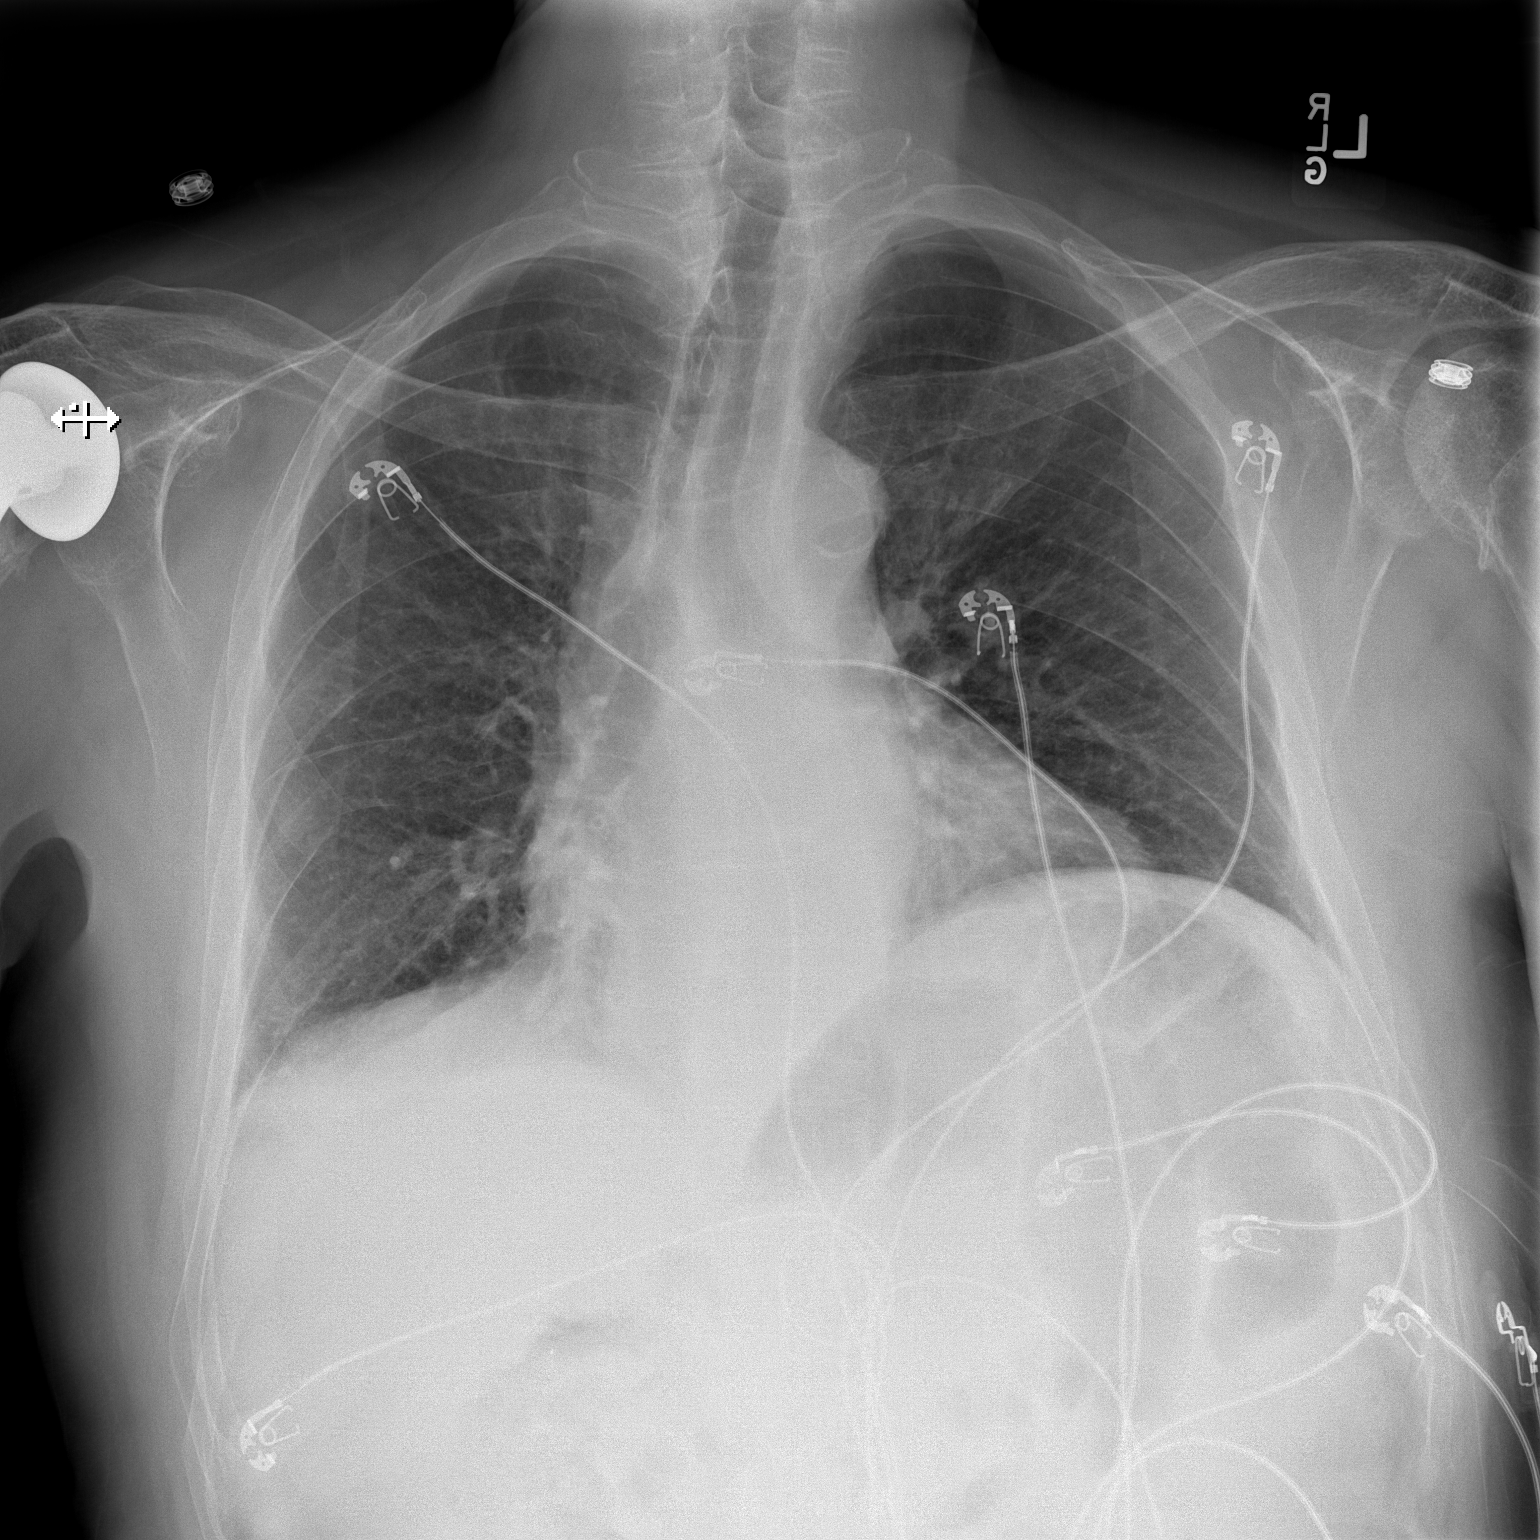

[w chest lat]
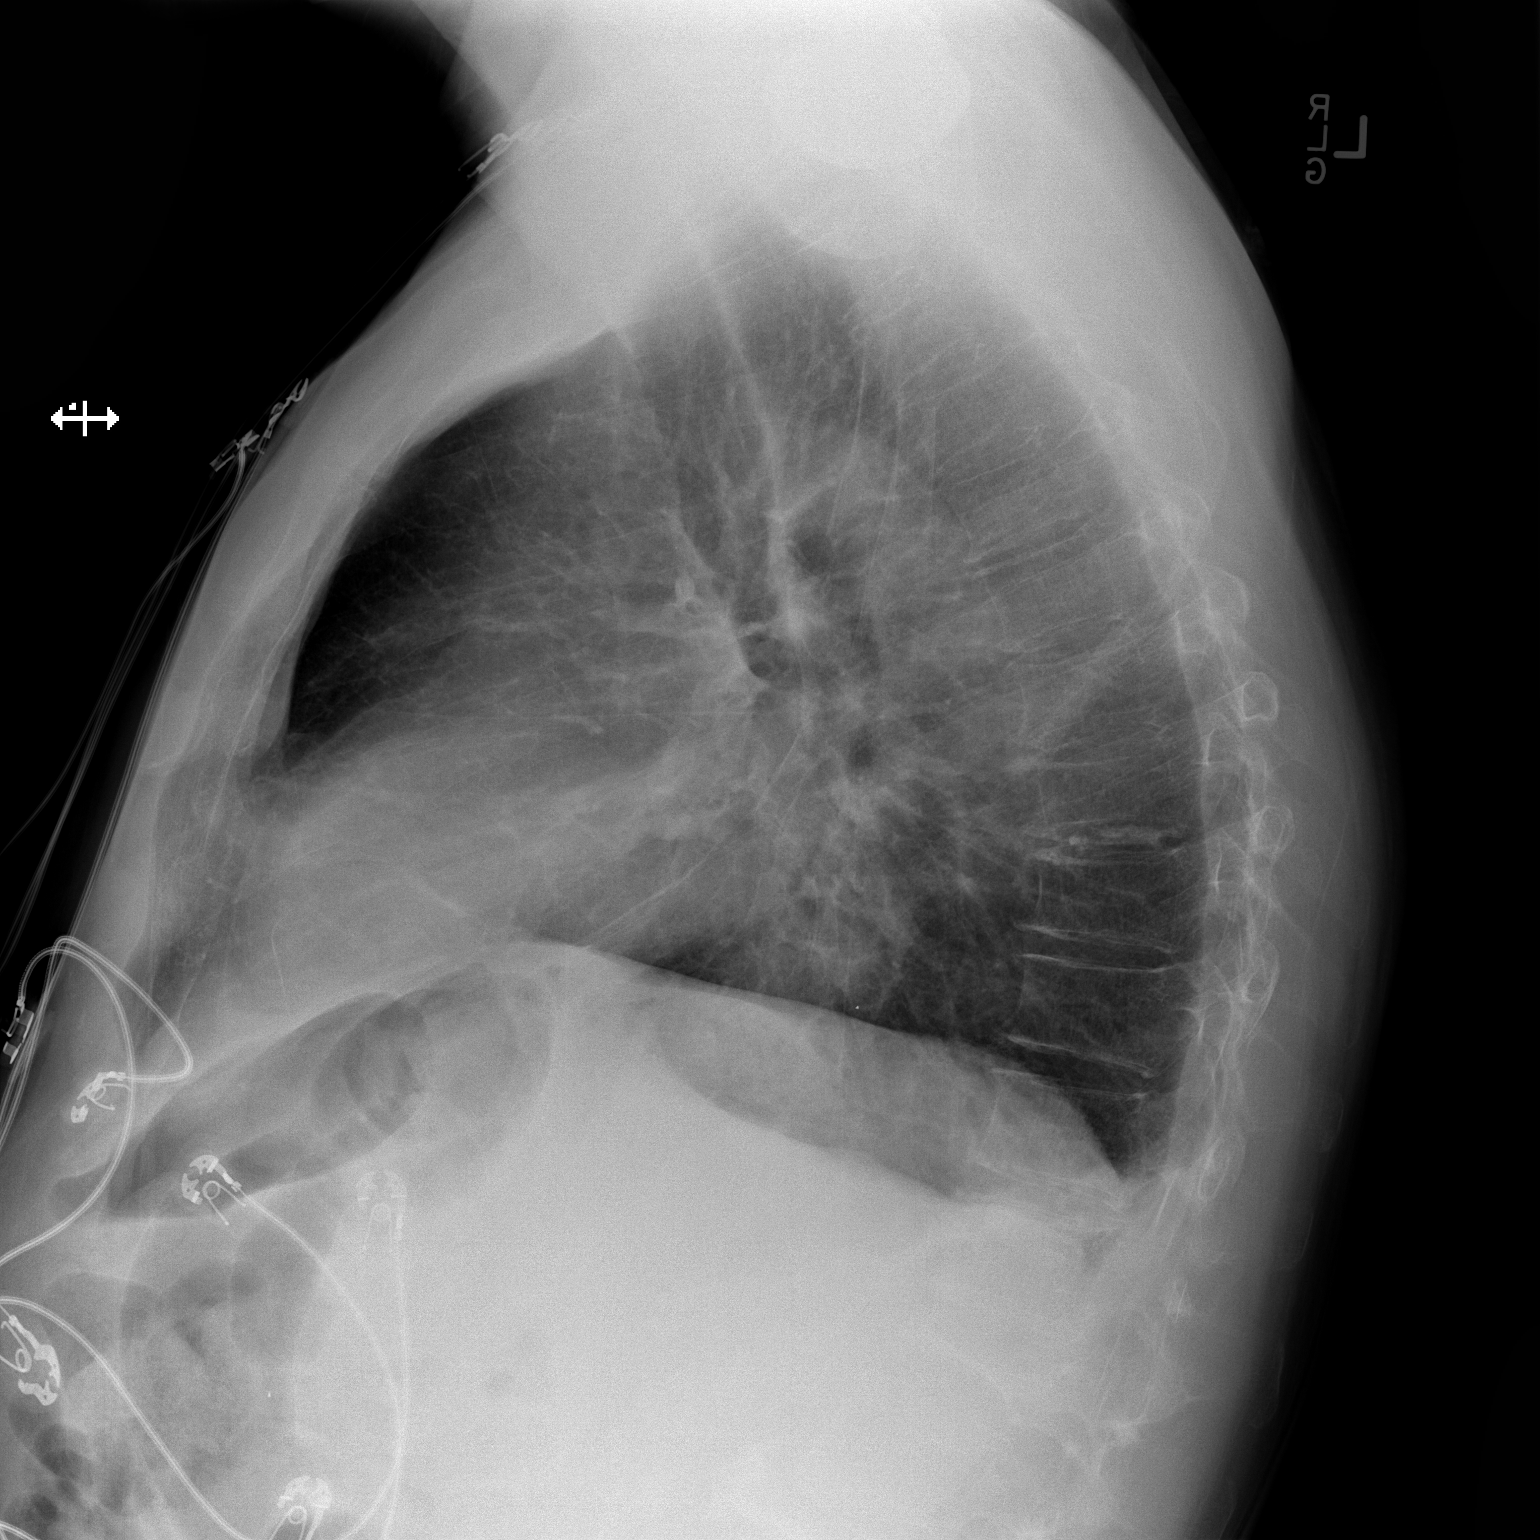

[2 of 2 positions shown; findings below may reference images not displayed]

FINDINGS: Cardiomediastinal silhouette is stable. There is chronic mild
elevation of the left hemidiaphragm. No acute infiltrate or pleural
effusion. No pulmonary edema. Osteopenia and degenerative changes
thoracic spine. Stable mild compression deformity mid thoracic
spine. There is significant compression deformity upper lumbar spine
of indeterminate age. Clinical correlation is necessary.
IMPRESSION: There is chronic mild elevation of the left hemidiaphragm. No acute
infiltrate or pleural effusion. No pulmonary edema. Osteopenia and
degenerative changes thoracic spine. Stable mild compression
deformity mid thoracic spine. There is significant compression
deformity upper lumbar spine of indeterminate age. Clinical
correlation is necessary.
